# Patient Record
Sex: Female | Born: 1985 | Race: White | Hispanic: No | Marital: Married | Smoking: Never smoker
Health system: Southern US, Community
[De-identification: ages and names within clinical notes are randomized; demographics above are authoritative.]

## PROBLEM LIST (undated history)

## (undated) ENCOUNTER — Ambulatory Visit: Admission: EM | Payer: 59

## (undated) DIAGNOSIS — F419 Anxiety disorder, unspecified: Secondary | ICD-10-CM

## (undated) DIAGNOSIS — R12 Heartburn: Secondary | ICD-10-CM

## (undated) DIAGNOSIS — R7303 Prediabetes: Secondary | ICD-10-CM

## (undated) DIAGNOSIS — I10 Essential (primary) hypertension: Secondary | ICD-10-CM

## (undated) DIAGNOSIS — O139 Gestational [pregnancy-induced] hypertension without significant proteinuria, unspecified trimester: Secondary | ICD-10-CM

## (undated) DIAGNOSIS — K829 Disease of gallbladder, unspecified: Secondary | ICD-10-CM

## (undated) DIAGNOSIS — R0602 Shortness of breath: Secondary | ICD-10-CM

## (undated) DIAGNOSIS — J45909 Unspecified asthma, uncomplicated: Secondary | ICD-10-CM

## (undated) DIAGNOSIS — K760 Fatty (change of) liver, not elsewhere classified: Secondary | ICD-10-CM

## (undated) DIAGNOSIS — F411 Generalized anxiety disorder: Secondary | ICD-10-CM

## (undated) DIAGNOSIS — K589 Irritable bowel syndrome without diarrhea: Secondary | ICD-10-CM

## (undated) DIAGNOSIS — M549 Dorsalgia, unspecified: Secondary | ICD-10-CM

## (undated) DIAGNOSIS — E7800 Pure hypercholesterolemia, unspecified: Secondary | ICD-10-CM

## (undated) DIAGNOSIS — T8859XA Other complications of anesthesia, initial encounter: Secondary | ICD-10-CM

## (undated) DIAGNOSIS — F988 Other specified behavioral and emotional disorders with onset usually occurring in childhood and adolescence: Secondary | ICD-10-CM

## (undated) DIAGNOSIS — Z91018 Allergy to other foods: Secondary | ICD-10-CM

## (undated) DIAGNOSIS — F53 Postpartum depression: Secondary | ICD-10-CM

## (undated) DIAGNOSIS — M2550 Pain in unspecified joint: Secondary | ICD-10-CM

## (undated) HISTORY — DX: Prediabetes: R73.03

## (undated) HISTORY — DX: Fatty (change of) liver, not elsewhere classified: K76.0

## (undated) HISTORY — DX: Disease of gallbladder, unspecified: K82.9

## (undated) HISTORY — DX: Postpartum depression: F53.0

## (undated) HISTORY — DX: Essential (primary) hypertension: I10

## (undated) HISTORY — DX: Other specified behavioral and emotional disorders with onset usually occurring in childhood and adolescence: F98.8

## (undated) HISTORY — DX: Gestational (pregnancy-induced) hypertension without significant proteinuria, unspecified trimester: O13.9

## (undated) HISTORY — DX: Pain in unspecified joint: M25.50

## (undated) HISTORY — DX: Dorsalgia, unspecified: M54.9

## (undated) HISTORY — DX: Heartburn: R12

## (undated) HISTORY — DX: Shortness of breath: R06.02

## (undated) HISTORY — PX: CHOLECYSTECTOMY: SHX55

## (undated) HISTORY — DX: Allergy to other foods: Z91.018

## (undated) HISTORY — DX: Pure hypercholesterolemia, unspecified: E78.00

## (undated) HISTORY — DX: Irritable bowel syndrome, unspecified: K58.9

## (undated) HISTORY — DX: Generalized anxiety disorder: F41.1

## (undated) HISTORY — PX: TONSILLECTOMY: SUR1361

---

## 2020-01-19 DIAGNOSIS — N925 Other specified irregular menstruation: Secondary | ICD-10-CM | POA: Diagnosis not present

## 2020-01-19 DIAGNOSIS — Z113 Encounter for screening for infections with a predominantly sexual mode of transmission: Secondary | ICD-10-CM | POA: Diagnosis not present

## 2020-01-19 DIAGNOSIS — R87810 Cervical high risk human papillomavirus (HPV) DNA test positive: Secondary | ICD-10-CM | POA: Diagnosis not present

## 2020-01-19 DIAGNOSIS — Z124 Encounter for screening for malignant neoplasm of cervix: Secondary | ICD-10-CM | POA: Diagnosis not present

## 2020-01-19 DIAGNOSIS — Z3201 Encounter for pregnancy test, result positive: Secondary | ICD-10-CM | POA: Diagnosis not present

## 2020-01-19 DIAGNOSIS — Z3480 Encounter for supervision of other normal pregnancy, unspecified trimester: Secondary | ICD-10-CM | POA: Diagnosis not present

## 2020-01-23 ENCOUNTER — Encounter (HOSPITAL_COMMUNITY): Payer: Self-pay | Admitting: Obstetrics

## 2020-01-23 ENCOUNTER — Inpatient Hospital Stay (HOSPITAL_COMMUNITY): Payer: 59

## 2020-01-23 ENCOUNTER — Inpatient Hospital Stay (HOSPITAL_COMMUNITY)
Admission: AD | Admit: 2020-01-23 | Discharge: 2020-01-23 | Disposition: A | Discharge status: Home / Self Care | Payer: 59 | Attending: Obstetrics | Admitting: Obstetrics

## 2020-01-23 ENCOUNTER — Other Ambulatory Visit: Payer: Self-pay

## 2020-01-23 DIAGNOSIS — O209 Hemorrhage in early pregnancy, unspecified: Secondary | ICD-10-CM | POA: Diagnosis not present

## 2020-01-23 DIAGNOSIS — Z3A09 9 weeks gestation of pregnancy: Secondary | ICD-10-CM | POA: Insufficient documentation

## 2020-01-23 DIAGNOSIS — O99511 Diseases of the respiratory system complicating pregnancy, first trimester: Secondary | ICD-10-CM | POA: Diagnosis not present

## 2020-01-23 DIAGNOSIS — Z881 Allergy status to other antibiotic agents status: Secondary | ICD-10-CM | POA: Diagnosis not present

## 2020-01-23 DIAGNOSIS — J45909 Unspecified asthma, uncomplicated: Secondary | ICD-10-CM | POA: Diagnosis not present

## 2020-01-23 DIAGNOSIS — Z885 Allergy status to narcotic agent status: Secondary | ICD-10-CM | POA: Diagnosis not present

## 2020-01-23 DIAGNOSIS — O26891 Other specified pregnancy related conditions, first trimester: Secondary | ICD-10-CM | POA: Diagnosis not present

## 2020-01-23 DIAGNOSIS — O4690 Antepartum hemorrhage, unspecified, unspecified trimester: Secondary | ICD-10-CM

## 2020-01-23 DIAGNOSIS — Z880 Allergy status to penicillin: Secondary | ICD-10-CM | POA: Insufficient documentation

## 2020-01-23 DIAGNOSIS — O4691 Antepartum hemorrhage, unspecified, first trimester: Secondary | ICD-10-CM | POA: Diagnosis not present

## 2020-01-23 DIAGNOSIS — Z888 Allergy status to other drugs, medicaments and biological substances status: Secondary | ICD-10-CM | POA: Diagnosis not present

## 2020-01-23 DIAGNOSIS — R1030 Lower abdominal pain, unspecified: Secondary | ICD-10-CM | POA: Insufficient documentation

## 2020-01-23 DIAGNOSIS — Z6790 Unspecified blood type, Rh positive: Secondary | ICD-10-CM

## 2020-01-23 HISTORY — DX: Unspecified asthma, uncomplicated: J45.909

## 2020-01-23 LAB — URINALYSIS, ROUTINE W REFLEX MICROSCOPIC
Bilirubin Urine: NEGATIVE
Glucose, UA: NEGATIVE mg/dL
Ketones, ur: NEGATIVE mg/dL
Leukocytes,Ua: NEGATIVE
Nitrite: NEGATIVE
Protein, ur: NEGATIVE mg/dL
Specific Gravity, Urine: 1.009 (ref 1.005–1.030)
pH: 5 (ref 5.0–8.0)

## 2020-01-23 LAB — ABO/RH: ABO/RH(D): A POS

## 2020-01-23 LAB — CBC
HCT: 36.1 % (ref 36.0–46.0)
Hemoglobin: 12.2 g/dL (ref 12.0–15.0)
MCH: 29.8 pg (ref 26.0–34.0)
MCHC: 33.8 g/dL (ref 30.0–36.0)
MCV: 88 fL (ref 80.0–100.0)
Platelets: 212 10*3/uL (ref 150–400)
RBC: 4.1 MIL/uL (ref 3.87–5.11)
RDW: 11.9 % (ref 11.5–15.5)
WBC: 9.1 10*3/uL (ref 4.0–10.5)
nRBC: 0 % (ref 0.0–0.2)

## 2020-01-23 LAB — HCG, QUANTITATIVE, PREGNANCY: hCG, Beta Chain, Quant, S: 112576 m[IU]/mL — ABNORMAL HIGH (ref <5)

## 2020-01-23 LAB — POCT PREGNANCY, URINE: Preg Test, Ur: POSITIVE — AB

## 2020-01-23 NOTE — MAU Note (Signed)
Presents with lower abdominal cramping & heavy spotting.  Reports VB was pinkish earlier to day and has become darker as day progressed.  Reports unsure if passed clot or tissue.

## 2020-01-23 NOTE — MAU Provider Note (Addendum)
History     CSN: [id number]  Arrival date and time: 01/23/20 1540   First Provider Initiated Contact with Patient 01/23/20 1722      Chief Complaint  Patient presents with  . Vaginal Bleeding  . Cramping   34 y.o. G2P0010 @[redacted]w[redacted]d  presenting with VB. Sx started this am, was pink initially when she wiped then became red and heavier during the day. No recent sex. Reports intermittent low abdominal cramping but not a new symptom. Rates 5/10. Has not used anything for it. Denies urinary sx. Had an in office last week, denies complications.   OB History    Gravida  2   Para      Term      Preterm      AB  1   Living        SAB      TAB      Ectopic      Multiple      Live Births              Past Medical History:  Diagnosis Date  . Asthma     Past Surgical History:  Procedure Laterality Date  . CHOLECYSTECTOMY    . TONSILLECTOMY      No family history on file.  Social History   Tobacco Use  . Smoking status: Not on file  Substance Use Topics  . Alcohol use: Not on file  . Drug use: Not on file    Allergies:  Allergies  Allergen Reactions  . Augmentin [Amoxicillin-Pot Clavulanate] Hives    States that she can take amoxicillin  . Ceclor [Cefaclor] Hives  . Demerol [Meperidine] Hives  . Compazine [Prochlorperazine] Anxiety    No medications prior to admission.    Review of Systems  Gastrointestinal: Positive for abdominal pain.  Genitourinary: Positive for vaginal bleeding. Negative for difficulty urinating, dysuria, hematuria and urgency.   Physical Exam   Blood pressure 133/70, pulse 85, temperature 98.1 F (36.7 C), temperature source Oral, resp. rate 16, height 5\' 1"  (1.549 m), weight 134.3 kg, last menstrual period 11/18/2019, SpO2 100 %.  Physical Exam  Nursing note and vitals reviewed. Constitutional: She is oriented to person, place, and time.  HENT:  Head: Normocephalic and atraumatic.  Cardiovascular: Normal rate.   Respiratory: Effort normal. No respiratory distress.  GI: Soft. Normal appearance. She exhibits no distension and no mass. There is no abdominal tenderness. There is no rebound and no guarding.  Genitourinary:    Genitourinary Comments: External: no lesions or erythema Vagina: rugated, pink, moist, scant F[place]ila bloody discharge Uterus: + enlarged, anteverted, non tender, no CMT Adnexae: no masses, no tenderness left, no tenderness right Cervix closed    Musculoskeletal:        General: Normal range of motion.     Cervical back: Normal range of motion.  Neurological: She is alert and oriented to person, place, and time.  Skin: Skin is warm and dry.  Psychiatric: Her mood appears anxious.   Results for orders placed or performed during the hospital encounter of 01/23/20 (from the past 24 hour(s))  Pregnancy, urine POC     Status: Abnormal   Collection Time: 01/23/20  4:15 PM  Result Value Ref Range   Preg Test, Ur POSITIVE (A) NEGATIVE  Urinalysis, Routine w reflex microscopic     Status: Abnormal   Collection Time: 01/23/20  5:00 PM  Result Value Ref Range   Color, Urine YELLOW YELLOW   APPearance CLEAR  CLEAR   Specific Gravity, Urine 1.009 1.005 - 1.030   pH 5.0 5.0 - 8.0   Glucose, UA NEGATIVE NEGATIVE mg/dL   Hgb urine dipstick LARGE (A) NEGATIVE   Bilirubin Urine NEGATIVE NEGATIVE   Ketones, ur NEGATIVE NEGATIVE mg/dL   Protein, ur NEGATIVE NEGATIVE mg/dL   Nitrite NEGATIVE NEGATIVE   Leukocytes,Ua NEGATIVE NEGATIVE   RBC / HPF 0-5 0 - 5 RBC/hpf   WBC, UA 0-5 0 - 5 WBC/hpf   Bacteria, UA RARE (A) NONE SEEN   Squamous Epithelial / LPF 0-5 0 - 5  ABO/Rh     Status: None   Collection Time: 01/23/20  5:30 PM  Result Value Ref Range   ABO/RH(D) A POS    No rh immune globuloin      NOT A RH IMMUNE GLOBULIN CANDIDATE, PT RH POSITIVE Performed at [facility] [address] [place], [place] [address]   CBC     Status: None   Collection Time: 01/23/20  5:30 PM   Result Value Ref Range   WBC 9.1 4.0 - 10.5 K/uL   RBC 4.10 3.87 - 5.11 MIL/uL   Hemoglobin 12.2 12.0 - 15.0 g/dL   HCT 36.1 36 - 46 %   MCV 88.0 80.0 - 100.0 fL   MCH 29.8 26.0 - 34.0 pg   MCHC 33.8 30.0 - 36.0 g/dL   RDW 11.9 11.5 - 15.5 %   Platelets 212 150 - 400 K/uL   nRBC 0.0 0.0 - 0.2 %   US OB Comp Less 14 Wks  Result Date: 01/23/2020 CLINICAL DATA:  Pregnant patient in first-trimester pregnancy with vaginal bleeding. Lower abdominal cramping. EXAM: OBSTETRIC <14 WK ULTRASOUND TECHNIQUE: Transabdominal ultrasound was performed for evaluation of the gestation as well as the maternal uterus and adnexal regions. COMPARISON:  None. FINDINGS: Intrauterine gestational sac: Single Yolk sac:  Visualized. Embryo:  Visualized. Cardiac Activity: Visualized. Heart Rate: 189 bpm CRL:   28.9 mm   9 w 4 d                  [place] EDC: 08/23/2020 Subchorionic hemorrhage:  None visualized. Maternal uterus/adnexae: Both ovaries are visualized and are normal. No adnexal mass. No pelvic free fluid. IMPRESSION: 1. Single live intrauterine pregnancy estimated gestational age [redacted] weeks 4 days for ultrasound [facility] 08/23/2020. 2. No subchorionic hemorrhage. Electronically Signed   By: Keith Rake M.D.   On: 01/23/2020 17:59   MAU Course  Procedures  MDM Labs and [place] ordered and reviewed. Viable IUP on [place], s/d consistent, no [place]. Discussed results with pt, pt reassured. Stable for discharge home.   Assessment and Plan  [redacted] weeks gestation Rh positive Vaginal bleeding in pregnancy Discharge home Follow up at [facility] as scheduled SAB precautions Pelvic rest  Allergies as of 01/23/2020      Reactions   Augmentin [amoxicillin-pot Clavulanate] Hives   States that she can take amoxicillin   Ceclor [cefaclor] Hives   Demerol [meperidine] Hives   Compazine [prochlorperazine] Anxiety      Medication List    TAKE these medications   acetaminophen 500 MG tablet Commonly known as: TYLENOL Take  1,000 mg by mouth every 6 (six) hours as needed.   albuterol 108 (90 Base) MCG/ACT inhaler Commonly known as: VENTOLIN HFA Inhale 1-2 puffs into the lungs every 6 (six) hours as needed for wheezing or shortness of breath.   prenatal multivitamin Tabs tablet Take 1 tablet by mouth daily  at 12 noon.      Donette Larry, CNM 01/23/2020, 6:33 PM

## 2020-01-23 NOTE — Discharge Instructions (Signed)

## 2020-02-17 DIAGNOSIS — Z3480 Encounter for supervision of other normal pregnancy, unspecified trimester: Secondary | ICD-10-CM | POA: Diagnosis not present

## 2020-02-17 DIAGNOSIS — Z3682 Encounter for antenatal screening for nuchal translucency: Secondary | ICD-10-CM | POA: Diagnosis not present

## 2020-03-08 DIAGNOSIS — Z369 Encounter for antenatal screening, unspecified: Secondary | ICD-10-CM | POA: Diagnosis not present

## 2020-03-08 DIAGNOSIS — Z3482 Encounter for supervision of other normal pregnancy, second trimester: Secondary | ICD-10-CM | POA: Diagnosis not present

## 2020-04-10 DIAGNOSIS — Z363 Encounter for antenatal screening for malformations: Secondary | ICD-10-CM | POA: Diagnosis not present

## 2020-05-07 DIAGNOSIS — Z362 Encounter for other antenatal screening follow-up: Secondary | ICD-10-CM | POA: Diagnosis not present

## 2020-05-08 DIAGNOSIS — Z3689 Encounter for other specified antenatal screening: Secondary | ICD-10-CM | POA: Diagnosis not present

## 2020-06-06 DIAGNOSIS — Z3A28 28 weeks gestation of pregnancy: Secondary | ICD-10-CM | POA: Diagnosis not present

## 2020-06-06 DIAGNOSIS — O3663X0 Maternal care for excessive fetal growth, third trimester, not applicable or unspecified: Secondary | ICD-10-CM | POA: Diagnosis not present

## 2020-06-06 DIAGNOSIS — Z23 Encounter for immunization: Secondary | ICD-10-CM | POA: Diagnosis not present

## 2020-06-15 ENCOUNTER — Inpatient Hospital Stay (HOSPITAL_COMMUNITY)
Admission: AD | Admit: 2020-06-15 | Discharge: 2020-06-15 | Disposition: A | Discharge status: Home / Self Care | Payer: 59 | Attending: Obstetrics and Gynecology | Admitting: Obstetrics and Gynecology

## 2020-06-15 ENCOUNTER — Encounter (HOSPITAL_COMMUNITY): Payer: Self-pay | Admitting: Obstetrics and Gynecology

## 2020-06-15 DIAGNOSIS — J45909 Unspecified asthma, uncomplicated: Secondary | ICD-10-CM | POA: Insufficient documentation

## 2020-06-15 DIAGNOSIS — O368130 Decreased fetal movements, third trimester, not applicable or unspecified: Secondary | ICD-10-CM | POA: Insufficient documentation

## 2020-06-15 DIAGNOSIS — O99513 Diseases of the respiratory system complicating pregnancy, third trimester: Secondary | ICD-10-CM | POA: Diagnosis not present

## 2020-06-15 DIAGNOSIS — Z3A30 30 weeks gestation of pregnancy: Secondary | ICD-10-CM | POA: Insufficient documentation

## 2020-06-15 HISTORY — DX: Other complications of anesthesia, initial encounter: T88.59XA

## 2020-06-15 NOTE — MAU Note (Signed)
Has not had a lot of fetal movement this past wk.  Contacted office, midwife wanted her to come over for an NST. No pain, bleeding or water leaking.

## 2020-06-15 NOTE — MAU Provider Note (Signed)
Chief Complaint:  Decreased Fetal Movement  First Provider Initiated Contact with Patient 06/15/20 1847     HPI: Tracy Chang is a 34 y.o. G2P0010 at 4w0dwho presents to maternity admissions reporting decreased fetal movement over the last week. States she feels the baby move but not as regularly as she had felt him. Denies leaking of fluid, vaginal bleeding, or contractions. Mother present and supportive.  Past Medical History: Past Medical History:  Diagnosis Date   Asthma    Complication of anesthesia    Conscious sedation-extreme agitation    Past obstetric history: OB History  Gravida Para Term Preterm AB Living  2       1    SAB TAB Ectopic Multiple Live Births               # Outcome Date GA Lbr Len/2nd Weight Sex Delivery Anes PTL Lv  2 Current           1 AB 2019            Past Surgical History: Past Surgical History:  Procedure Laterality Date   CHOLECYSTECTOMY     TONSILLECTOMY      Family History: No family history on file.  Social History: Social History   Tobacco Use   Smoking status: Not on file  Substance Use Topics   Alcohol use: Not on file   Drug use: Not on file    Allergies:  Allergies  Allergen Reactions   Augmentin [Amoxicillin-Pot Clavulanate] Hives    States that she can take amoxicillin   Ceclor [Cefaclor] Hives   Demerol [Meperidine] Hives   Latex    Compazine [Prochlorperazine] Anxiety    Meds:  Medications Prior to Admission  Medication Sig Dispense Refill Last Dose   acetaminophen (TYLENOL) 500 MG tablet Take 1,000 mg by mouth every 6 (six) hours as needed.      albuterol (VENTOLIN HFA) 108 (90 Base) MCG/ACT inhaler Inhale 1-2 puffs into the lungs every 6 (six) hours as needed for wheezing or shortness of breath.      Prenatal Vit-Fe Fumarate-FA (PRENATAL MULTIVITAMIN) TABS tablet Take 1 tablet by mouth daily at 12 noon.       I have reviewed patient's Past Medical Hx, Surgical Hx, Family Hx, Social Hx,  medications and allergies.   ROS:  Review of Systems  All other systems reviewed and are negative.  Other systems negative  Physical Exam   Patient Vitals for the past 24 hrs:  BP Temp Temp src Pulse Resp SpO2 Height Weight  06/15/20 1720 136/76 98.2 F (36.8 C) Oral 82 20 98 % 5\' 1"  (1.549 m) (!) 141.5 kg   Constitutional: Well-developed, well-nourished female in no acute distress.  Cardiovascular: normal rate and rhythm Respiratory: normal effort, clear to auscultation bilaterally GI: Abd soft, non-tender, gravid appropriate for gestational age.   No rebound or guarding. MS: Extremities nontender, no edema, normal ROM Neurologic: Alert and oriented x 4.  GU: Neg CVAT.  FHT: Baseline 135, moderate variability, accelerations present, no decelerations Contractions: None    MAU Course/MDM: I have ordered labs and reviewed results.  NST reviewed Consult with Dr. to discuss exam findings and test results.    Assessment: 1. Decreased fetal movement affecting management of pregnancy in third trimester, single or unspecified fetus    Plan: Discharge home Discussed labor precautions and fetal kick counts Follow up in office in 1 week at next scheduled appointment.  Follow-up Information    Obgyn, Amado Nash.  Schedule an appointment as soon as possible for a visit in 1 week(s).   Contact information: [address] [place] [address] [phone]              Pt stable at time of discharge.  June Leap 06/15/2020, 7:05 PM

## 2020-06-18 DIAGNOSIS — Z3689 Encounter for other specified antenatal screening: Secondary | ICD-10-CM | POA: Diagnosis not present

## 2020-06-18 DIAGNOSIS — Z3402 Encounter for supervision of normal first pregnancy, second trimester: Secondary | ICD-10-CM | POA: Diagnosis not present

## 2020-06-19 ENCOUNTER — Inpatient Hospital Stay: Payer: 59 | Attending: Oncology

## 2020-06-19 DIAGNOSIS — Z23 Encounter for immunization: Secondary | ICD-10-CM | POA: Insufficient documentation

## 2020-07-04 DIAGNOSIS — O3663X0 Maternal care for excessive fetal growth, third trimester, not applicable or unspecified: Secondary | ICD-10-CM | POA: Diagnosis not present

## 2020-07-04 DIAGNOSIS — Z3A32 32 weeks gestation of pregnancy: Secondary | ICD-10-CM | POA: Diagnosis not present

## 2020-07-12 DIAGNOSIS — L299 Pruritus, unspecified: Secondary | ICD-10-CM | POA: Diagnosis not present

## 2020-07-12 DIAGNOSIS — Z3A33 33 weeks gestation of pregnancy: Secondary | ICD-10-CM | POA: Diagnosis not present

## 2020-07-12 DIAGNOSIS — I10 Essential (primary) hypertension: Secondary | ICD-10-CM | POA: Diagnosis not present

## 2020-07-12 DIAGNOSIS — O133 Gestational [pregnancy-induced] hypertension without significant proteinuria, third trimester: Secondary | ICD-10-CM | POA: Diagnosis not present

## 2020-07-13 DIAGNOSIS — Z3483 Encounter for supervision of other normal pregnancy, third trimester: Secondary | ICD-10-CM | POA: Diagnosis not present

## 2020-07-13 DIAGNOSIS — Z3A34 34 weeks gestation of pregnancy: Secondary | ICD-10-CM | POA: Diagnosis not present

## 2020-07-13 DIAGNOSIS — O139 Gestational [pregnancy-induced] hypertension without significant proteinuria, unspecified trimester: Secondary | ICD-10-CM | POA: Diagnosis not present

## 2020-07-13 DIAGNOSIS — Z3482 Encounter for supervision of other normal pregnancy, second trimester: Secondary | ICD-10-CM | POA: Diagnosis not present

## 2020-07-16 ENCOUNTER — Inpatient Hospital Stay (HOSPITAL_COMMUNITY): Payer: 59

## 2020-07-16 ENCOUNTER — Encounter (HOSPITAL_COMMUNITY): Payer: Self-pay | Admitting: Obstetrics & Gynecology

## 2020-07-16 ENCOUNTER — Inpatient Hospital Stay (HOSPITAL_COMMUNITY)
Admission: AD | Admit: 2020-07-16 | Discharge: 2020-07-17 | Disposition: A | Discharge status: Home / Self Care | Payer: 59 | Attending: Obstetrics & Gynecology | Admitting: Obstetrics & Gynecology

## 2020-07-16 DIAGNOSIS — I517 Cardiomegaly: Secondary | ICD-10-CM | POA: Diagnosis not present

## 2020-07-16 DIAGNOSIS — R0602 Shortness of breath: Secondary | ICD-10-CM | POA: Diagnosis not present

## 2020-07-16 DIAGNOSIS — Z3A34 34 weeks gestation of pregnancy: Secondary | ICD-10-CM | POA: Diagnosis not present

## 2020-07-16 DIAGNOSIS — Z20822 Contact with and (suspected) exposure to covid-19: Secondary | ICD-10-CM | POA: Diagnosis not present

## 2020-07-16 DIAGNOSIS — O133 Gestational [pregnancy-induced] hypertension without significant proteinuria, third trimester: Secondary | ICD-10-CM | POA: Insufficient documentation

## 2020-07-16 DIAGNOSIS — R079 Chest pain, unspecified: Secondary | ICD-10-CM | POA: Diagnosis not present

## 2020-07-16 DIAGNOSIS — Z3689 Encounter for other specified antenatal screening: Secondary | ICD-10-CM | POA: Diagnosis not present

## 2020-07-16 DIAGNOSIS — J90 Pleural effusion, not elsewhere classified: Secondary | ICD-10-CM | POA: Diagnosis not present

## 2020-07-16 DIAGNOSIS — Z3480 Encounter for supervision of other normal pregnancy, unspecified trimester: Secondary | ICD-10-CM | POA: Diagnosis not present

## 2020-07-16 LAB — CBC
HCT: 33.6 % — ABNORMAL LOW (ref 36.0–46.0)
Hemoglobin: 11 g/dL — ABNORMAL LOW (ref 12.0–15.0)
MCH: 29.3 pg (ref 26.0–34.0)
MCHC: 32.7 g/dL (ref 30.0–36.0)
MCV: 89.6 fL (ref 80.0–100.0)
Platelets: 175 10*3/uL (ref 150–400)
RBC: 3.75 MIL/uL — ABNORMAL LOW (ref 3.87–5.11)
RDW: 13.2 % (ref 11.5–15.5)
WBC: 11.6 10*3/uL — ABNORMAL HIGH (ref 4.0–10.5)
nRBC: 0 % (ref 0.0–0.2)

## 2020-07-16 LAB — COMPREHENSIVE METABOLIC PANEL
ALT: 29 U/L (ref 0–44)
AST: 23 U/L (ref 15–41)
Albumin: 2.7 g/dL — ABNORMAL LOW (ref 3.5–5.0)
Alkaline Phosphatase: 87 U/L (ref 38–126)
Anion gap: 9 (ref 5–15)
BUN: 7 mg/dL (ref 6–20)
CO2: 26 mmol/L (ref 22–32)
Calcium: 9.2 mg/dL (ref 8.9–10.3)
Chloride: 104 mmol/L (ref 98–111)
Creatinine, Ser: 0.58 mg/dL (ref 0.44–1.00)
GFR, Estimated: >60 mL/min (ref >60)
Glucose, Bld: 126 mg/dL — ABNORMAL HIGH (ref 70–99)
Potassium: 3.5 mmol/L (ref 3.5–5.1)
Sodium: 139 mmol/L (ref 135–145)
Total Bilirubin: 0.5 mg/dL (ref 0.3–1.2)
Total Protein: 5.6 g/dL — ABNORMAL LOW (ref 6.5–8.1)

## 2020-07-16 LAB — PROTEIN / CREATININE RATIO, URINE
Creatinine, Urine: 49.6 mg/dL
Protein Creatinine Ratio: 0.2 mg/mg{Cre} — ABNORMAL HIGH (ref 0.00–0.15)
Total Protein, Urine: 10 mg/dL

## 2020-07-16 LAB — RESP PANEL BY RT-PCR (FLU A&B, COVID) ARPGX2
Influenza A by PCR: NEGATIVE
Influenza B by PCR: NEGATIVE
SARS Coronavirus 2 by RT PCR: NEGATIVE

## 2020-07-16 MED ORDER — IOHEXOL 350 MG/ML SOLN
100.0000 mL | Freq: Once | INTRAVENOUS | Status: AC | PRN
  Administered 2020-07-16: 100 mL via INTRAVENOUS

## 2020-07-16 NOTE — MAU Provider Note (Addendum)
History     CSN: [id number]  Arrival date and time: 07/16/20 3329   First Provider Initiated Contact with Patient 07/16/20 2121      Chief Complaint  Patient presents with  . Shortness of Breath  . wt gain   Tracy Chang is a 34 y.o. year old G27P0010 female at [redacted]w[redacted]d weeks gestation who was sent to MAU by [facility] reporting recent dx of gHTN, started on Labetalol 100 mg TID, worsening SOB, dependent edema, 14 lb weight gain (5 lbs since Thursday 07/12/20). She has a h/o asthma and has had to increase use of her inhaler; which is not helping with the SOB. She is having "difficulty catching a good breath." She noticed the increase in SOB after starting Labetalol. She has normal PEC labs Thursday; P/C ratio was 0.23. She received BMZ 12 mg injections x 2 on 12/2 & 12/3. BPP was 8/98 today in the office.   OB History    Gravida  2   Para      Term      Preterm      AB  1   Living        SAB      TAB      Ectopic      Multiple      Live Births              Past Medical History:  Diagnosis Date  . Asthma   . Complication of anesthesia    Conscious sedation-extreme agitation    Past Surgical History:  Procedure Laterality Date  . CHOLECYSTECTOMY    . TONSILLECTOMY      History reviewed. No pertinent family history.  Social History   Tobacco Use  . Smoking status: Never Smoker  . Smokeless tobacco: Never Used  Substance Use Topics  . Alcohol use: Yes    Comment: SOCIAL  . Drug use: Never    Allergies:  Allergies  Allergen Reactions  . Augmentin [Amoxicillin-Pot Clavulanate] Hives    States that she can take amoxicillin  . Ceclor [Cefaclor] Hives  . Demerol [Meperidine] Hives  . Latex   . Compazine [Prochlorperazine] Anxiety    No medications prior to admission.    Review of Systems  Constitutional: Negative.   HENT: Negative.   Eyes: Negative.   Respiratory: Positive for shortness of breath and wheezing (with exertion  ("moving around, getting up to the bathroom"; have increased use of inhaler without relief).   Cardiovascular: Positive for leg swelling.  Gastrointestinal: Negative.   Endocrine: Negative.   Genitourinary: Negative.   Musculoskeletal: Negative.   Skin: Negative.   Allergic/Immunologic: Negative.   Neurological: Positive for numbness (of hands/fingertips at times).  Hematological: Negative.   Psychiatric/Behavioral: Negative.    Physical Exam   Patient Vitals for the past 24 hrs:  BP Temp Temp src Pulse Resp SpO2 Height Weight  07/17/20 0008 132/61 97.7 F (36.5 C) Oral 81 20 96 % -- --  07/16/20 2350 -- -- -- -- -- 97 % -- --  07/16/20 2345 -- -- -- -- -- 97 % -- --  07/16/20 2220 -- -- -- -- -- 98 % -- --  07/16/20 2210 -- -- -- -- -- 97 % -- --  07/16/20 2201 (!) 148/77 -- -- 82 -- -- -- --  07/16/20 2132 (!) 150/82 -- -- 82 -- -- -- --  07/16/20 2130 -- -- -- -- -- 96 % -- --  07/16/20 2120 (!) 148/77 -- --  77 -- 95 % -- --  07/16/20 2058 138/68 -- -- 79 -- -- -- --  07/16/20 2032 134/70 -- -- 83 -- -- -- --  07/16/20 2030 -- -- -- -- -- 97 % -- --  07/16/20 2025 -- -- -- -- -- 97 % -- --  07/16/20 2018 (!) 149/76 -- -- 84 -- -- -- --  07/16/20 2015 -- -- -- -- -- 96 % -- --  07/16/20 2010 -- -- -- -- -- 96 % -- --  07/16/20 2005 -- -- -- -- -- 97 % -- --  07/16/20 2000 -- -- -- -- -- 97 % -- --  07/16/20 1955 -- -- -- -- -- 98 % -- --  07/16/20 1950 -- -- -- -- -- 97 % -- --  07/16/20 1945 -- -- -- -- -- 97 % -- --  07/16/20 1940 -- -- -- -- -- 98 % -- --  07/16/20 1935 -- -- -- -- -- 98 % -- --  07/16/20 1932 124/61 -- -- 86 -- -- -- --  07/16/20 1930 -- -- -- -- -- 97 % -- --  07/16/20 1850 136/68 98.5 F (36.9 C) Oral 84 (!) 24 97 % 5\' 1"  (1.549 m) (!) 147.6 kg  07/16/20 1849 -- -- -- -- -- 98 % -- --    Physical Exam Vitals and nursing note reviewed.  Constitutional:      Appearance: She is well-developed. She is obese.  HENT:     Head: Normocephalic and  atraumatic.  Cardiovascular:     Rate and Rhythm: Normal rate and regular rhythm.     Pulses: Normal pulses.  Pulmonary:     Effort: Respiratory distress present.     Breath sounds: Normal breath sounds. No wheezing, rhonchi or rales.  Abdominal:     General: Bowel sounds are normal.     Palpations: Abdomen is soft.  Genitourinary:    Comments: deferred Musculoskeletal:        General: Swelling (both hands) present. Normal range of motion.     Cervical back: Normal range of motion.     Right lower leg: Edema present.     Left lower leg: Edema (>RT) present.  Skin:    General: Skin is warm and dry.  Neurological:     Mental Status: She is alert and oriented to person, place, and time.  Psychiatric:        Mood and Affect: Mood normal.        Behavior: Behavior normal.        Thought Content: Thought content normal.        Judgment: Judgment normal.   EFM: 150 bpm, mod variability, + accels, no decels Toco: rare  Results for orders placed or performed during the hospital encounter of 07/16/20 (from the past 24 hour(s))  CBC     Status: Abnormal   Collection Time: 07/16/20  6:36 PM  Result Value Ref Range   WBC 11.6 (H) 4.0 - 10.5 K/uL   RBC 3.75 (L) 3.87 - 5.11 MIL/uL   Hemoglobin 11.0 (L) 12.0 - 15.0 g/dL   HCT 14/06/21 (L) 36 - 46 %   MCV 89.6 80.0 - 100.0 fL   MCH 29.3 26.0 - 34.0 pg   MCHC 32.7 30.0 - 36.0 g/dL   RDW 49.7 02.6 - 37.8 %   Platelets 175 150 - 400 K/uL   nRBC 0.0 0.0 - 0.2 %  Comprehensive metabolic panel     Status:  Abnormal   Collection Time: 07/16/20  6:36 PM  Result Value Ref Range   Sodium 139 135 - 145 mmol/L   Potassium 3.5 3.5 - 5.1 mmol/L   Chloride 104 98 - 111 mmol/L   CO2 26 22 - 32 mmol/L   Glucose, Bld 126 (H) 70 - 99 mg/dL   BUN 7 6 - 20 mg/dL   Creatinine, Ser 3.080.58 0.44 - 1.00 mg/dL   Calcium 9.2 8.9 - 65.710.3 mg/dL   Total Protein 5.6 (L) 6.5 - 8.1 g/dL   Albumin 2.7 (L) 3.5 - 5.0 g/dL   AST 23 15 - 41 U/L   ALT 29 0 - 44 U/L    Alkaline Phosphatase 87 38 - 126 U/L   Total Bilirubin 0.5 0.3 - 1.2 mg/dL   GFR, Estimated >84>60 >69>60 mL/min   Anion gap 9 5 - 15  Protein / creatinine ratio, urine     Status: Abnormal   Collection Time: 07/16/20  8:30 PM  Result Value Ref Range   Creatinine, Urine 49.60 mg/dL   Total Protein, Urine 10 mg/dL   Protein Creatinine Ratio 0.20 (H) 0.00 - 0.15 mg/mg[Cre]  Resp Panel by RT-PCR (Flu A&B, Covid) Nasopharyngeal Swab     Status: None   Collection Time: 07/16/20 10:04 PM   Specimen: Nasopharyngeal Swab; Nasopharyngeal(NP) swabs in vial transport medium  Result Value Ref Range   SARS Coronavirus 2 by RT PCR NEGATIVE NEGATIVE   Influenza A by PCR NEGATIVE NEGATIVE   Influenza B by PCR NEGATIVE NEGATIVE   CT Angio Chest PE W and/or Wo Contrast  Result Date: 07/16/2020 CLINICAL DATA:  Chest pain. Shortness of breath. Thirty-four weeks pregnant. EXAM: CT ANGIOGRAPHY CHEST WITH CONTRAST TECHNIQUE: Multidetector CT imaging of the chest was performed using the standard protocol during bolus administration of intravenous contrast. Multiplanar CT image reconstructions and MIPs were obtained to evaluate the vascular anatomy. CONTRAST:  100mL OMNIPAQUE IOHEXOL 350 MG/ML SOLN COMPARISON:  None. FINDINGS: Cardiovascular: Satisfactory contrast bolus. Beam attenuation caused by patient body habitus limits assessment beyond the proximal segmental level. Within that limitation, there is no central pulmonary embolus. The size of the main pulmonary artery is normal. Mild cardiomegaly. The course and caliber of the aorta are normal. There is no atherosclerotic calcification. Opacification decreased due to pulmonary arterial phase contrast bolus timing. Mediastinum/Nodes: -- No mediastinal lymphadenopathy. -- No hilar lymphadenopathy. -- No axillary lymphadenopathy. -- No supraclavicular lymphadenopathy. -- Normal thyroid gland where visualized. -  Unremarkable esophagus. Lungs/Pleura: There is a trace left-sided  pleural effusion. There is no focal infiltrate. There is no pneumothorax. Upper Abdomen: Contrast bolus timing is not optimized for evaluation of the abdominal organs. The visualized portions of the organs of the upper abdomen are normal. Musculoskeletal: No chest wall abnormality. No bony spinal canal stenosis. Review of the MIP images confirms the above findings. IMPRESSION: 1. Habitus limited study. Within that limitation, there is no central pulmonary embolus. 2. Trace left-sided pleural effusion. 3. Mild cardiomegaly. Electronically Signed   By: Katherine Mantlehristopher  Green M.D.   On: 07/16/2020 23:10   DG Chest Portable 1 View  Result Date: 07/16/2020 CLINICAL DATA:  Shortness of breath. EXAM: PORTABLE CHEST 1 VIEW COMPARISON:  None. FINDINGS: Enlarged cardiac silhouette. Otherwise cardiomediastinal silhouette are unremarkable. No focal consolidation. No pulmonary edema. No pleural effusion. No pneumothorax. No acute osseous abnormality. IMPRESSION: Enlarged cardiac silhouette. Electronically Signed   By: Tish FredericksonMorgane  Naveau M.D.   On: 07/16/2020 21:39   MAU Course  Procedures  MDM CCUA CBC CMP P/C Ratio Serial BP's  EKG 1 view CXR CT scan  Report given to and care assumed by Donette Larry, CNM @ 2157  Raelyn Mora, CNM 07/16/2020, 9:21 PM  Labs and imaging reviewed. No signs of PE, pulmonary edema, or PEC. o2 sat 97% on RA and normal WOB on reassessment, and pt reports improvement. Sx likely d/t BMI and advancing gestation. Consult with Dr. Donavan Foil, plan for outpt management.   Assessment and Plan    1. [redacted] weeks gestation of pregnancy   2. NST (non-stress test) reactive   3. Gestational hypertension, third trimester    Discharge home Follow up at [facility] in 3 days Strict PEC precautions  Allergies as of 07/17/2020      Reactions   Augmentin [amoxicillin-pot Clavulanate] Hives   States that she can take amoxicillin   Ceclor [cefaclor] Hives   Demerol [meperidine] Hives   Latex     Compazine [prochlorperazine] Anxiety      Medication List    TAKE these medications   acetaminophen 500 MG tablet Commonly known as: TYLENOL Take 1,000 mg by mouth every 6 (six) hours as needed.   albuterol 108 (90 Base) MCG/ACT inhaler Commonly known as: VENTOLIN HFA Inhale 1-2 puffs into the lungs every 6 (six) hours as needed for wheezing or shortness of breath.   labetalol 100 MG tablet Commonly known as: NORMODYNE Take 100 mg by mouth in the morning, at noon, and at bedtime.   prenatal multivitamin Tabs tablet Take 1 tablet by mouth daily at 12 noon.      Donette Larry, CNM  07/17/2020 12:41 AM

## 2020-07-16 NOTE — MAU Note (Signed)
Pt almost ran in when name called, encouraged her to slow down. SOB started last Friday.  Was started on Labetalol TID received Betamethasone injections last Thu/Fri.  Gained 9# last wk and 7 this wk.   Denies pain, bleeding or leaking. Slight HA, denies visual changes or epigastric pain. Report increased swelling all over.

## 2020-07-17 DIAGNOSIS — M5489 Other dorsalgia: Secondary | ICD-10-CM | POA: Diagnosis not present

## 2020-07-17 DIAGNOSIS — Z3689 Encounter for other specified antenatal screening: Secondary | ICD-10-CM | POA: Diagnosis not present

## 2020-07-17 DIAGNOSIS — Z3A34 34 weeks gestation of pregnancy: Secondary | ICD-10-CM | POA: Diagnosis not present

## 2020-07-17 DIAGNOSIS — O133 Gestational [pregnancy-induced] hypertension without significant proteinuria, third trimester: Secondary | ICD-10-CM | POA: Diagnosis not present

## 2020-07-17 DIAGNOSIS — Z20822 Contact with and (suspected) exposure to covid-19: Secondary | ICD-10-CM | POA: Diagnosis not present

## 2020-07-17 NOTE — Discharge Instructions (Signed)

## 2020-07-19 DIAGNOSIS — Z3483 Encounter for supervision of other normal pregnancy, third trimester: Secondary | ICD-10-CM | POA: Diagnosis not present

## 2020-07-19 DIAGNOSIS — O133 Gestational [pregnancy-induced] hypertension without significant proteinuria, third trimester: Secondary | ICD-10-CM | POA: Diagnosis not present

## 2020-07-19 DIAGNOSIS — Z3A34 34 weeks gestation of pregnancy: Secondary | ICD-10-CM | POA: Diagnosis not present

## 2020-07-23 ENCOUNTER — Telehealth (HOSPITAL_COMMUNITY): Payer: Self-pay | Admitting: *Deleted

## 2020-07-23 DIAGNOSIS — Z3A35 35 weeks gestation of pregnancy: Secondary | ICD-10-CM | POA: Diagnosis not present

## 2020-07-23 DIAGNOSIS — Z7689 Persons encountering health services in other specified circumstances: Secondary | ICD-10-CM | POA: Diagnosis not present

## 2020-07-23 DIAGNOSIS — O99213 Obesity complicating pregnancy, third trimester: Secondary | ICD-10-CM | POA: Diagnosis not present

## 2020-07-23 DIAGNOSIS — O133 Gestational [pregnancy-induced] hypertension without significant proteinuria, third trimester: Secondary | ICD-10-CM | POA: Diagnosis not present

## 2020-07-23 DIAGNOSIS — Z3685 Encounter for antenatal screening for Streptococcus B: Secondary | ICD-10-CM | POA: Diagnosis not present

## 2020-07-23 DIAGNOSIS — O163 Unspecified maternal hypertension, third trimester: Secondary | ICD-10-CM | POA: Diagnosis not present

## 2020-07-23 DIAGNOSIS — O169 Unspecified maternal hypertension, unspecified trimester: Secondary | ICD-10-CM | POA: Diagnosis not present

## 2020-07-23 NOTE — Telephone Encounter (Signed)
Preadmission screen  

## 2020-07-24 ENCOUNTER — Encounter (HOSPITAL_COMMUNITY): Payer: Self-pay | Admitting: *Deleted

## 2020-07-24 ENCOUNTER — Telehealth (HOSPITAL_COMMUNITY): Payer: Self-pay | Admitting: *Deleted

## 2020-07-24 NOTE — Telephone Encounter (Signed)
Preadmission screen  

## 2020-07-30 DIAGNOSIS — Z3A36 36 weeks gestation of pregnancy: Secondary | ICD-10-CM | POA: Diagnosis not present

## 2020-07-30 DIAGNOSIS — O99213 Obesity complicating pregnancy, third trimester: Secondary | ICD-10-CM | POA: Diagnosis not present

## 2020-07-30 DIAGNOSIS — O164 Unspecified maternal hypertension, complicating childbirth: Secondary | ICD-10-CM | POA: Diagnosis not present

## 2020-07-30 DIAGNOSIS — O133 Gestational [pregnancy-induced] hypertension without significant proteinuria, third trimester: Secondary | ICD-10-CM | POA: Diagnosis not present

## 2020-08-05 ENCOUNTER — Encounter (HOSPITAL_COMMUNITY): Payer: Self-pay | Admitting: Obstetrics and Gynecology

## 2020-08-05 ENCOUNTER — Inpatient Hospital Stay (HOSPITAL_COMMUNITY): Payer: 59 | Admitting: Anesthesiology

## 2020-08-05 ENCOUNTER — Inpatient Hospital Stay (HOSPITAL_COMMUNITY)
Admission: AD | Admit: 2020-08-05 | Discharge: 2020-08-09 | DRG: 787 | Disposition: A | Discharge status: Home / Self Care | Payer: 59 | Attending: Obstetrics and Gynecology | Admitting: Obstetrics and Gynecology

## 2020-08-05 ENCOUNTER — Other Ambulatory Visit: Payer: Self-pay

## 2020-08-05 ENCOUNTER — Inpatient Hospital Stay (HOSPITAL_COMMUNITY): Payer: 59

## 2020-08-05 DIAGNOSIS — Z7982 Long term (current) use of aspirin: Secondary | ICD-10-CM

## 2020-08-05 DIAGNOSIS — Z79899 Other long term (current) drug therapy: Secondary | ICD-10-CM | POA: Diagnosis not present

## 2020-08-05 DIAGNOSIS — K219 Gastro-esophageal reflux disease without esophagitis: Secondary | ICD-10-CM | POA: Diagnosis present

## 2020-08-05 DIAGNOSIS — O139 Gestational [pregnancy-induced] hypertension without significant proteinuria, unspecified trimester: Secondary | ICD-10-CM | POA: Diagnosis present

## 2020-08-05 DIAGNOSIS — Z20822 Contact with and (suspected) exposure to covid-19: Secondary | ICD-10-CM | POA: Diagnosis present

## 2020-08-05 DIAGNOSIS — O9962 Diseases of the digestive system complicating childbirth: Secondary | ICD-10-CM | POA: Diagnosis present

## 2020-08-05 DIAGNOSIS — D62 Acute posthemorrhagic anemia: Secondary | ICD-10-CM | POA: Diagnosis not present

## 2020-08-05 DIAGNOSIS — O134 Gestational [pregnancy-induced] hypertension without significant proteinuria, complicating childbirth: Secondary | ICD-10-CM | POA: Diagnosis not present

## 2020-08-05 DIAGNOSIS — O99214 Obesity complicating childbirth: Secondary | ICD-10-CM | POA: Diagnosis present

## 2020-08-05 DIAGNOSIS — Z98891 History of uterine scar from previous surgery: Secondary | ICD-10-CM

## 2020-08-05 DIAGNOSIS — Z3A37 37 weeks gestation of pregnancy: Secondary | ICD-10-CM

## 2020-08-05 DIAGNOSIS — F419 Anxiety disorder, unspecified: Secondary | ICD-10-CM | POA: Diagnosis present

## 2020-08-05 DIAGNOSIS — Z3A39 39 weeks gestation of pregnancy: Secondary | ICD-10-CM | POA: Diagnosis not present

## 2020-08-05 DIAGNOSIS — O9081 Anemia of the puerperium: Secondary | ICD-10-CM | POA: Diagnosis not present

## 2020-08-05 DIAGNOSIS — F32A Depression, unspecified: Secondary | ICD-10-CM | POA: Diagnosis present

## 2020-08-05 DIAGNOSIS — O9952 Diseases of the respiratory system complicating childbirth: Secondary | ICD-10-CM | POA: Diagnosis present

## 2020-08-05 DIAGNOSIS — O99344 Other mental disorders complicating childbirth: Secondary | ICD-10-CM | POA: Diagnosis present

## 2020-08-05 DIAGNOSIS — O3663X0 Maternal care for excessive fetal growth, third trimester, not applicable or unspecified: Secondary | ICD-10-CM | POA: Diagnosis present

## 2020-08-05 DIAGNOSIS — J45909 Unspecified asthma, uncomplicated: Secondary | ICD-10-CM | POA: Diagnosis present

## 2020-08-05 DIAGNOSIS — O324XX0 Maternal care for high head at term, not applicable or unspecified: Secondary | ICD-10-CM | POA: Diagnosis present

## 2020-08-05 DIAGNOSIS — Z412 Encounter for routine and ritual male circumcision: Secondary | ICD-10-CM | POA: Diagnosis not present

## 2020-08-05 HISTORY — DX: Anxiety disorder, unspecified: F41.9

## 2020-08-05 LAB — COMPREHENSIVE METABOLIC PANEL
ALT: 15 U/L (ref 0–44)
AST: 17 U/L (ref 15–41)
Albumin: 2.7 g/dL — ABNORMAL LOW (ref 3.5–5.0)
Alkaline Phosphatase: 108 U/L (ref 38–126)
Anion gap: 11 (ref 5–15)
BUN: 13 mg/dL (ref 6–20)
CO2: 23 mmol/L (ref 22–32)
Calcium: 9.6 mg/dL (ref 8.9–10.3)
Chloride: 103 mmol/L (ref 98–111)
Creatinine, Ser: 0.7 mg/dL (ref 0.44–1.00)
GFR, Estimated: >60 mL/min (ref >60)
Glucose, Bld: 105 mg/dL — ABNORMAL HIGH (ref 70–99)
Potassium: 3.7 mmol/L (ref 3.5–5.1)
Sodium: 137 mmol/L (ref 135–145)
Total Bilirubin: 0.2 mg/dL — ABNORMAL LOW (ref 0.3–1.2)
Total Protein: 5.9 g/dL — ABNORMAL LOW (ref 6.5–8.1)

## 2020-08-05 LAB — CBC WITH DIFFERENTIAL/PLATELET
Abs Immature Granulocytes: 0.04 10*3/uL (ref 0.00–0.07)
Basophils Absolute: 0 10*3/uL (ref 0.0–0.1)
Basophils Relative: 0 %
Eosinophils Absolute: 0.1 10*3/uL (ref 0.0–0.5)
Eosinophils Relative: 1 %
HCT: 35 % — ABNORMAL LOW (ref 36.0–46.0)
Hemoglobin: 11.9 g/dL — ABNORMAL LOW (ref 12.0–15.0)
Immature Granulocytes: 0 %
Lymphocytes Relative: 11 %
Lymphs Abs: 1.3 10*3/uL (ref 0.7–4.0)
MCH: 29.7 pg (ref 26.0–34.0)
MCHC: 34 g/dL (ref 30.0–36.0)
MCV: 87.3 fL (ref 80.0–100.0)
Monocytes Absolute: 0.6 10*3/uL (ref 0.1–1.0)
Monocytes Relative: 6 %
Neutro Abs: 9.4 10*3/uL — ABNORMAL HIGH (ref 1.7–7.7)
Neutrophils Relative %: 82 %
Platelets: 192 10*3/uL (ref 150–400)
RBC: 4.01 MIL/uL (ref 3.87–5.11)
RDW: 13.4 % (ref 11.5–15.5)
WBC: 11.6 10*3/uL — ABNORMAL HIGH (ref 4.0–10.5)
nRBC: 0 % (ref 0.0–0.2)

## 2020-08-05 LAB — CBC
HCT: 33.9 % — ABNORMAL LOW (ref 36.0–46.0)
Hemoglobin: 11.8 g/dL — ABNORMAL LOW (ref 12.0–15.0)
MCH: 30.8 pg (ref 26.0–34.0)
MCHC: 34.8 g/dL (ref 30.0–36.0)
MCV: 88.5 fL (ref 80.0–100.0)
Platelets: 181 10*3/uL (ref 150–400)
RBC: 3.83 MIL/uL — ABNORMAL LOW (ref 3.87–5.11)
RDW: 13.2 % (ref 11.5–15.5)
WBC: 12.6 10*3/uL — ABNORMAL HIGH (ref 4.0–10.5)
nRBC: 0 % (ref 0.0–0.2)

## 2020-08-05 LAB — TYPE AND SCREEN
ABO/RH(D): A POS
Antibody Screen: NEGATIVE

## 2020-08-05 LAB — RPR: RPR Ser Ql: NONREACTIVE

## 2020-08-05 LAB — RESP PANEL BY RT-PCR (FLU A&B, COVID) ARPGX2
Influenza A by PCR: NEGATIVE
Influenza B by PCR: NEGATIVE
SARS Coronavirus 2 by RT PCR: NEGATIVE

## 2020-08-05 MED ORDER — SODIUM CHLORIDE 0.9% FLUSH: 3.0000 mL | INTRAVENOUS | Status: DC | PRN

## 2020-08-05 MED ORDER — PHENYLEPHRINE 40 MCG/ML (10ML) SYRINGE FOR IV PUSH (FOR BLOOD PRESSURE SUPPORT): 80.0000 ug | PREFILLED_SYRINGE | INTRAVENOUS | Status: DC | PRN

## 2020-08-05 MED ORDER — DIPHENHYDRAMINE HCL 50 MG/ML IJ SOLN: 12.5000 mg | INTRAMUSCULAR | Status: DC | PRN

## 2020-08-05 MED ORDER — SODIUM CHLORIDE (PF) 0.9 % IJ SOLN
INTRAMUSCULAR | Status: DC | PRN
  Administered 2020-08-05: 12 mL/h via EPIDURAL

## 2020-08-05 MED ORDER — OXYTOCIN BOLUS FROM INFUSION: 333.0000 mL | Freq: Once | INTRAVENOUS | Status: DC

## 2020-08-05 MED ORDER — LACTATED RINGERS IV SOLN: 500.0000 mL | INTRAVENOUS | Status: DC | PRN

## 2020-08-05 MED ORDER — LIDOCAINE HCL (PF) 1 % IJ SOLN: 30.0000 mL | INTRAMUSCULAR | Status: DC | PRN

## 2020-08-05 MED ORDER — SOD CITRATE-CITRIC ACID 500-334 MG/5ML PO SOLN
30.0000 mL | ORAL | Status: DC | PRN
  Administered 2020-08-06: 09:00:00 30 mL via ORAL
  Filled 2020-08-05: qty 15

## 2020-08-05 MED ORDER — SODIUM CHLORIDE 0.9 % IV SOLN: 250.0000 mL | INTRAVENOUS | Status: DC | PRN

## 2020-08-05 MED ORDER — SODIUM CHLORIDE 0.9% FLUSH: 3.0000 mL | Freq: Two times a day (BID) | INTRAVENOUS | Status: DC

## 2020-08-05 MED ORDER — LACTATED RINGERS IV SOLN: INTRAVENOUS | Status: DC

## 2020-08-05 MED ORDER — OXYTOCIN-SODIUM CHLORIDE 30-0.9 UT/500ML-% IV SOLN: 2.5000 [IU]/h | INTRAVENOUS | Status: DC

## 2020-08-05 MED ORDER — OXYTOCIN 10 UNIT/ML IJ SOLN: 10.0000 [IU] | Freq: Once | INTRAMUSCULAR | Status: DC

## 2020-08-05 MED ORDER — OXYTOCIN-SODIUM CHLORIDE 30-0.9 UT/500ML-% IV SOLN
1.0000 m[IU]/min | INTRAVENOUS | Status: DC
  Administered 2020-08-05: 10:00:00 2 m[IU]/min via INTRAVENOUS
  Filled 2020-08-05: qty 500

## 2020-08-05 MED ORDER — MISOPROSTOL 50MCG HALF TABLET
50.0000 ug | ORAL_TABLET | ORAL | Status: DC
  Administered 2020-08-05 (×2): 50 ug via ORAL
  Filled 2020-08-05 (×2): qty 1

## 2020-08-05 MED ORDER — FENTANYL CITRATE (PF) 100 MCG/2ML IJ SOLN
50.0000 ug | INTRAMUSCULAR | Status: DC | PRN
Start: 2020-08-05 — End: 2020-08-06
  Administered 2020-08-05: 50 ug via INTRAVENOUS
  Filled 2020-08-05: qty 2

## 2020-08-05 MED ORDER — TERBUTALINE SULFATE 1 MG/ML IJ SOLN: 0.2500 mg | Freq: Once | INTRAMUSCULAR | Status: DC | PRN

## 2020-08-05 MED ORDER — LACTATED RINGERS IV SOLN: 500.0000 mL | Freq: Once | INTRAVENOUS | Status: DC

## 2020-08-05 MED ORDER — ONDANSETRON HCL 4 MG/2ML IJ SOLN
4.0000 mg | Freq: Four times a day (QID) | INTRAMUSCULAR | Status: DC | PRN
  Administered 2020-08-05: 08:00:00 4 mg via INTRAVENOUS
  Filled 2020-08-05: qty 2

## 2020-08-05 MED ORDER — FENTANYL-BUPIVACAINE-NACL 0.5-0.125-0.9 MG/250ML-% EP SOLN
12.0000 mL/h | EPIDURAL | Status: DC | PRN
  Filled 2020-08-05: qty 250

## 2020-08-05 MED ORDER — EPHEDRINE 5 MG/ML INJ: 10.0000 mg | INTRAVENOUS | Status: DC | PRN

## 2020-08-05 MED ORDER — LIDOCAINE HCL (PF) 1 % IJ SOLN
INTRAMUSCULAR | Status: DC | PRN
  Administered 2020-08-05: 10 mL via EPIDURAL

## 2020-08-05 MED ORDER — ACETAMINOPHEN 500 MG PO TABS: 1000.0000 mg | ORAL_TABLET | Freq: Four times a day (QID) | ORAL | Status: DC | PRN

## 2020-08-05 NOTE — Plan of Care (Signed)
BP (!) 144/63   Pulse 77   Temp 97.9 F (36.6 C) (Oral)   Resp 18   Ht 5\' 1"  (154.9 cm)   Wt (!) 147.1 kg   LMP 11/18/2019   BMI 61.29 kg/m   Pt resting in bed s/p foley balloon insertion. Given PRN fentanyl and zofran for pain and nausea relief.   Problem: Coping: Goal: Ability to verbalize concerns and feelings about labor and delivery will improve Outcome: Progressing   Problem: Education: Goal: Ability to state and carry out methods to decrease the pain will improve Outcome: Progressing   Problem: Education: Goal: Ability to make informed decisions regarding treatment and plan of care will improve Outcome: Progressing

## 2020-08-05 NOTE — Progress Notes (Signed)
Tracy Chang is a 34 y.o. G2P0010 at [redacted]w[redacted]d by LMP admitted for IOL due to gHTN  Subjective: Resting in bed. Reports mild contractions that she is feeling in her back. [name], husband, at the bedside. Discussed the R/B/A of Foley balloon placement for cervical ripening and patient consents to procedure.   Objective: Vitals:   08/05/20 0013 08/05/20 0027 08/05/20 0102 08/05/20 0527  BP:  136/81 121/70 (!) 144/63  Pulse:  89 82 77  Resp:  18    Temp:  97.9 F (36.6 C)    TempSrc:  Oral    Weight: (!) 147.1 kg     Height: 5\' 1"  (1.549 m)       FHT:  FHR: 140 bpm, variability: moderate,  accelerations:  Present,  decelerations:  Absent UC:   irregular, every 3-8 minutes SVE:   Dilation: 3 Effacement (%): 50 Station: -3 Exam by:: [email or web address], CNM   Foley balloon placed without difficulty. 60cc of fluid instilled in balloon and taped to leg for traction.   Labs: Results for orders placed or performed during the hospital encounter of 08/05/20 (from the past 24 hour(s))  Type and screen [facility]     Status: None   Collection Time: 08/05/20 12:25 AM  Result Value Ref Range   ABO/RH(D) A POS    Antibody Screen NEG    Sample Expiration      08/08/2020,2359 Performed at [facility], [address], [place], [place]   CBC     Status: Abnormal   Collection Time: 08/05/20 12:31 AM  Result Value Ref Range   WBC 12.6 (H) 4.0 - 10.5 K/uL   RBC 3.83 (L) 3.87 - 5.11 MIL/uL   Hemoglobin 11.8 (L) 12.0 - 15.0 g/dL   HCT 08/07/20 (L) 62.7 - 03.5 %   MCV 88.5 80.0 - 100.0 fL   MCH 30.8 26.0 - 34.0 pg   MCHC 34.8 30.0 - 36.0 g/dL   RDW 00.9 38.1 - 82.9 %   Platelets 181 150 - 400 K/uL   nRBC 0.0 0.0 - 0.2 %  Comprehensive metabolic panel     Status: Abnormal   Collection Time: 08/05/20 12:31 AM  Result Value Ref Range   Sodium 137 135 - 145 mmol/L   Potassium 3.7 3.5 - 5.1 mmol/L   Chloride 103 98 - 111 mmol/L   CO2 23 22 - 32 mmol/L   Glucose, Bld 105  (H) 70 - 99 mg/dL   BUN 13 6 - 20 mg/dL   Creatinine, Ser 08/07/20 0.44 - 1.00 mg/dL   Calcium 9.6 8.9 - 1.69 mg/dL   Total Protein 5.9 (L) 6.5 - 8.1 g/dL   Albumin 2.7 (L) 3.5 - 5.0 g/dL   AST 17 15 - 41 U/L   ALT 15 0 - 44 U/L   Alkaline Phosphatase 108 38 - 126 U/L   Total Bilirubin 0.2 (L) 0.3 - 1.2 mg/dL   GFR, Estimated 67.8 >93 mL/min   Anion gap 11 5 - 15  Resp Panel by RT-PCR (Flu A&B, Covid) Nasopharyngeal Swab     Status: None   Collection Time: 08/05/20  1:00 AM   Specimen: Nasopharyngeal Swab; Nasopharyngeal(NP) swabs in vial transport medium  Result Value Ref Range   SARS Coronavirus 2 by RT PCR NEGATIVE NEGATIVE   Influenza A by PCR NEGATIVE NEGATIVE   Influenza B by PCR NEGATIVE NEGATIVE   Assessment / Plan: G2P0010 34 y.o. [redacted]w[redacted]d Induction of labor due  to gestational hypertension, s/p 1 dose of buccal Cytotec  Labor: Cervical ripening, buccal Cytotec and Foley balloon placed Preeclampsia:  no signs or symptoms of toxicity and labs stable Fetal Wellbeing:  Category I Pain Control:  Labor support without medications I/D:  GBS negative Anticipated MOD:  NSVD   Repeat Cytotec buccally. Will recheck SVE in 4 hours or earlier if necessary.   June Leap, CNM, MSN 08/05/2020, 5:29 AM

## 2020-08-05 NOTE — Anesthesia Preprocedure Evaluation (Signed)
Anesthesia Evaluation  Patient identified by MRN, date of birth, ID band Patient awake    Reviewed: Allergy & Precautions, NPO status , Patient's Chart, lab work & pertinent test results  Airway Mallampati: IV       Dental no notable dental hx.    Pulmonary asthma ,    Pulmonary exam normal        Cardiovascular hypertension, Pt. on home beta blockers Normal cardiovascular exam     Neuro/Psych negative neurological ROS     GI/Hepatic negative GI ROS, Neg liver ROS,   Endo/Other  Morbid obesity  Renal/GU negative Renal ROS  negative genitourinary   Musculoskeletal negative musculoskeletal ROS (+)   Abdominal (+) + obese,   Peds  Hematology  (+) anemia ,   Anesthesia Other Findings   Reproductive/Obstetrics (+) Pregnancy                             Anesthesia Physical Anesthesia Plan  ASA: III  Anesthesia Plan: Epidural   Post-op Pain Management:    Induction:   PONV Risk Score and Plan:   Airway Management Planned:   Additional Equipment:   Intra-op Plan:   Post-operative Plan:   Informed Consent: I have reviewed the patients History and Physical, chart, labs and discussed the procedure including the risks, benefits and alternatives for the proposed anesthesia with the patient or authorized representative who has indicated his/her understanding and acceptance.       Plan Discussed with: CRNA  Anesthesia Plan Comments:         Anesthesia Quick Evaluation

## 2020-08-05 NOTE — H&P (Addendum)
OB ADMISSION/ HISTORY & PHYSICAL:  Admission Date: 08/05/2020 12:10 AM  Admit Diagnosis: Gestational hypertension [O13.9]    Tracy Chang is a 34 y.o. female presenting for IOL for gestational hypertension. Reports occasional contractions. Denies leaking of fluid or vaginal bleeding. Endorses + fetal movement. Denies headache, epigastric pain, or visual changes. Husband, Tracy Chang, at the bedside for support. Mother and doula, [name], will join support team in the morning. Eagerly anticipating baby boy, Tracy Chang.   Prenatal History: G2P0010   EDC : 08/24/2020, by Last Menstrual Period  Prenatal care at [facility] since 17 weeks, primary A. Yetta Barre CNM, transfer from [facility]  Prenatal course complicated by: 1. BMI 61, ASA daily 2. Gestational hypertension, diagnosed at 33 weeks, on Labetalol 200mg  TID, negative PEC labs on 12/13 in office 3. Betamethasone complete, 12/2 and 12/3, at diagnosis of gHTN at 33 weeks per MD consult 4. LGA, last growth at 36 weeks 86%, 33 weeks 98% 5. Asthma, prn albuterol inhaler 6. GERD, on Pepcid 20mg  7. Depression and anxiety, on Zoloft 50mg  daily, stable  Prenatal Labs: ABO, Rh:   A POS Antibody:   Negative Rubella:   Immune RPR:   Non-reactive HBsAg:   Negative HIV:   Negative GBS:   Negative 1 hr Glucola : 118 Genetic Screening: NT WNL, AFP-1 negative, declined Panorama Ultrasound: normal XY anatomy, vertex, anterior placenta, AFI 11.5cm, growth at 36 weeks 3303gm, 86%  TDaP         UTD Flu             UTD COVID-19 UTD    Maternal Diabetes: No Genetic Screening: Normal Maternal Ultrasounds/Referrals: Normal Fetal Ultrasounds or other Referrals:  None Maternal Substance Abuse:  No Significant Maternal Medications:  Meds include: Zoloft Other: Labetalol Significant Maternal Lab Results:  Group B Strep negative Other Comments:  None  Medical / Surgical History : Past medical history:  Past Medical History:  Diagnosis Date  . Anxiety   .  Asthma   . Complication of anesthesia    Conscious sedation-extreme agitation  . Pregnancy induced hypertension     Past surgical history:  Past Surgical History:  Procedure Laterality Date  . CHOLECYSTECTOMY    . TONSILLECTOMY      Family History:  Family History  Problem Relation Age of Onset  . Hypertension Mother     Social History:  reports that she has never smoked. She has never used smokeless tobacco. She reports previous alcohol use. She reports that she does not use drugs.  Allergies: Augmentin [amoxicillin-pot clavulanate], Ceclor [cefaclor], Demerol [meperidine], Latex, Versed [midazolam], and Compazine [prochlorperazine]   Current Medications at time of admission:  Medications Prior to Admission  Medication Sig Dispense Refill Last Dose  . acetaminophen (TYLENOL) 500 MG tablet Take 1,000 mg by mouth every 6 (six) hours as needed.   08/04/2020 at Unknown time  . albuterol (VENTOLIN HFA) 108 (90 Base) MCG/ACT inhaler Inhale 1-2 puffs into the lungs every 6 (six) hours as needed for wheezing or shortness of breath.   Past Week at Unknown time  . labetalol (NORMODYNE) 100 MG tablet Take 100 mg by mouth in the morning, at noon, and at bedtime.   08/04/2020 at Unknown time  . Prenatal Vit-Fe Fumarate-FA (PRENATAL MULTIVITAMIN) TABS tablet Take 1 tablet by mouth daily at 12 noon.   08/04/2020 at Unknown time    Review of Systems: Review of Systems  Constitutional: Negative for chills and fever.  HENT: Negative for congestion and sore throat.  Eyes: Negative for blurred vision.  Respiratory: Positive for shortness of breath. Negative for cough.   Cardiovascular: Positive for leg swelling. Negative for chest pain.  Gastrointestinal: Positive for heartburn. Negative for abdominal pain, nausea and vomiting.  Musculoskeletal: Negative for back pain and falls.  Neurological: Negative for dizziness and headaches.  Psychiatric/Behavioral: Negative for depression.   Physical  Exam: Vital signs and nursing notes reviewed.  Patient Vitals for the past 24 hrs:  BP Pulse Height Weight  08/05/20 0102 121/70 82 -- --  08/05/20 0013 -- -- 5\' 1"  (1.549 m) (!) 147.1 kg    General: AAO x 3, NAD Heart: RRR Lungs:CTAB Abdomen: Gravid, NT, Leopold's vertex Extremities: 2+ non-pitting edema to lower extremities Genitalia / VE: Dilation: 3 Effacement (%): 50 Station: -3 Presentation: Vertex Exam by:: [email or web address], CNM   FHR: 140BPM, mod variability, + accels, no decels TOCO: Contractions occasional  Labs:   Pending T&S, CBC, RPR  Recent Labs    08/05/20 0031  WBC 12.6*  HGB 11.8*  HCT 33.9*  PLT 181   Assessment:  35 y.o. G2P0010 at [redacted]w[redacted]d, gestational hypertension  1. IOL for gHTN 2. FHR category 1 3. GBS negative 4. Desires epidural 5. Plans to breastfeed 6. Desires inpatient circumcision 7. Gestational hypertension on PO Labetalol 200mg  TID 8. Asthma, stable on albuterol inhaler prn 9. LGA, last growth 86% at 36 weeks 10. Anxiety and depression, stable on Zoloft 11. Betamethasone complete, 12/2 and 12/3  Plan:  1. Admit to BS 2. Routine L&D orders 3. Analgesia/anesthesia PRN  4. Cytotec 14/2 buccal every 4 hours for cervical ripening. Will place Foley balloon when able. 5. Will check PEC labs on admission, BP stable 6. Labetalol protocol for severe range blood pressures  7. Anticipate NSVB 8. Close PP follow-up for BP and PPD  Dr. 14/3 notified of admission/plan of care.  CNM, MSN 08/05/2020, 1:14 AM

## 2020-08-05 NOTE — Anesthesia Procedure Notes (Signed)
Epidural Patient location during procedure: OB Start time: 08/05/2020 7:07 PM End time: 08/05/2020 7:10 PM  Staffing Anesthesiologist: Leilani Able, MD Performed: anesthesiologist   Preanesthetic Checklist Completed: patient identified, IV checked, site marked, risks and benefits discussed, surgical consent, monitors and equipment checked, pre-op evaluation and timeout performed  Epidural Patient position: sitting Prep: DuraPrep and site prepped and draped Patient monitoring: continuous pulse ox and blood pressure Approach: midline Location: L3-L4 Injection technique: LOR air  Needle:  Needle type: Tuohy  Needle gauge: 17 G Needle length: 9 cm and 9 Needle insertion depth: 9 cm Catheter type: closed end flexible Catheter size: 19 Gauge Catheter at skin depth: 15 cm Test dose: negative and Other  Assessment Events: blood not aspirated, injection not painful, no injection resistance, no paresthesia and negative IV test  Additional Notes Reason for block:procedure for pain

## 2020-08-05 NOTE — Progress Notes (Signed)
Tracy Chang is a 34 y.o. G2P0010 at [redacted]w[redacted]d by LMP admitted for IOL for gHTN  Subjective: Relieved to have Foley balloon out. Reports feeling much better. Reports mild contractions. Husband, [name], mother, and doula, [name], at the bedside providing support.   Objective: Vitals:   08/05/20 0013 08/05/20 0027 08/05/20 0102 08/05/20 0527  BP:  136/81 121/70 (!) 144/63  Pulse:  89 82 77  Resp:  18    Temp:  97.9 F (36.6 C)    TempSrc:  Oral    Weight: (!) 147.1 kg     Height: 5\' 1"  (1.549 m)       FHT:  FHR: 140 bpm, variability: moderate,  accelerations:  Present,  decelerations:  Absent UC:   irregular SVE:   Dilation: 4.5 Effacement (%): 60 Station: -3 Exam by:: [email or web address] CNM   Labs: Results for orders placed or performed during the hospital encounter of 08/05/20 (from the past 24 hour(s))  Type and screen [facility]     Status: None   Collection Time: 08/05/20 12:25 AM  Result Value Ref Range   ABO/RH(D) A POS    Antibody Screen NEG    Sample Expiration      08/08/2020,2359 Performed at [facility], [address], [place], [place]   CBC     Status: Abnormal   Collection Time: 08/05/20 12:31 AM  Result Value Ref Range   WBC 12.6 (H) 4.0 - 10.5 K/uL   RBC 3.83 (L) 3.87 - 5.11 MIL/uL   Hemoglobin 11.8 (L) 12.0 - 15.0 g/dL   HCT 08/07/20 (L) 31.5 - 40.0 %   MCV 88.5 80.0 - 100.0 fL   MCH 30.8 26.0 - 34.0 pg   MCHC 34.8 30.0 - 36.0 g/dL   RDW 86.7 61.9 - 50.9 %   Platelets 181 150 - 400 K/uL   nRBC 0.0 0.0 - 0.2 %  Comprehensive metabolic panel     Status: Abnormal   Collection Time: 08/05/20 12:31 AM  Result Value Ref Range   Sodium 137 135 - 145 mmol/L   Potassium 3.7 3.5 - 5.1 mmol/L   Chloride 103 98 - 111 mmol/L   CO2 23 22 - 32 mmol/L   Glucose, Bld 105 (H) 70 - 99 mg/dL   BUN 13 6 - 20 mg/dL   Creatinine, Ser 08/07/20 0.44 - 1.00 mg/dL   Calcium 9.6 8.9 - 7.12 mg/dL   Total Protein 5.9 (L) 6.5 - 8.1 g/dL   Albumin 2.7 (L)  3.5 - 5.0 g/dL   AST 17 15 - 41 U/L   ALT 15 0 - 44 U/L   Alkaline Phosphatase 108 38 - 126 U/L   Total Bilirubin 0.2 (L) 0.3 - 1.2 mg/dL   GFR, Estimated 45.8 >09 mL/min   Anion gap 11 5 - 15  Resp Panel by RT-PCR (Flu A&B, Covid) Nasopharyngeal Swab     Status: None   Collection Time: 08/05/20  1:00 AM   Specimen: Nasopharyngeal Swab; Nasopharyngeal(NP) swabs in vial transport medium  Result Value Ref Range   SARS Coronavirus 2 by RT PCR NEGATIVE NEGATIVE   Influenza A by PCR NEGATIVE NEGATIVE   Influenza B by PCR NEGATIVE NEGATIVE   Assessment / Plan: G2P0010 34 y.o. [redacted]w[redacted]d Induction of labor due to gestational hypertension, s/p 2 doses of buccal Cytotec and Foley balloon  Labor: Progressing normally Preeclampsia:  no signs or symptoms of toxicity and labs stable Fetal Wellbeing:  Category I  Pain Control:  IV pain meds I/D:  GBS negative Anticipated MOD:  NSVD   Will start Pitocin 2x2 until adequate contractions. Will consider AROM with adequate contractions and descent of fetal vertex. Will place internal monitoring when appropriate. Patient may have epidural upon request.   Clancy Gourd, MSN 08/05/2020, 9:40 AM

## 2020-08-05 NOTE — Progress Notes (Signed)
Tracy Chang is a 34 y.o. G2P0010 at [redacted]w[redacted]d by LMP admitted for IOL for gHTN  Subjective: Comfortable with epidural. Husband, Tracy Chang, mother, and doula, Tracy Chang, at the bedside providing support. Discussed the R/B/A of IUPC and FSE for labor monitoring and patient consents to procedure.   Objective: Vitals with BMI 08/05/2020 08/05/2020 08/05/2020  Height - - -  Weight - - -  BMI - - -  Systolic 118 121 756  Diastolic 58 52 77  Pulse 79 71 75   FHT:  FHR: 140 bpm, variability: moderate,  accelerations:  Present,  decelerations:  Absent UC:   irregular SVE:   Dilation: 5 Effacement (%): 70 Station: -2 Exam by:: Dorisann Frames CNM   IUPC and FSE placed without difficulty.  Assessment / Plan: G2P0010 34 y.o. [redacted]w[redacted]d Induction of labor due to gestational hypertension, Pitocin infusing at 37mu  Labor: Progressing towards active stage of labor Preeclampsia:  no signs or symptoms of toxicity and labs stable Fetal Wellbeing:  Category I Pain Control:  Epidural I/D:  GBS negative Anticipated MOD:  NSVD   Continue to titrate Pitocin until adequate MVUs. Will recheck SVE in 4-6 hours or sooner if necessary.   Dr. Billy Coast updated on patient status and plan of care.   June Leap, CNM, MSN 08/05/2020, 9:16 PM

## 2020-08-05 NOTE — Progress Notes (Signed)
Tracy Chang is a 34 y.o. G2P0010 at [redacted]w[redacted]d by LMP admitted for IOL for gHTN  Subjective: Reports mild cramping and intermittent leaking of a small amount of fluid. Husband, [name], mother, and doula, [name], at the bedside providing support. Discussed the R/B/A of AROM for labor induction and patient consents to procedure.   Objective: Vitals with BMI 08/05/2020 08/05/2020 08/05/2020  Height - - -  Weight - - -  BMI - - -  Systolic 144 137 161  Diastolic 83 73 63  Pulse 81 85 77   FHT:  FHR: 140 bpm, variability: moderate,  accelerations:  Present,  decelerations:  Absent UC:   irregular SVE:   Dilation: 4 Effacement (%): 50 Station: -2 Exam by:: Izabel Chim CNM   AROM of copious amount of clear fluid at 1600  Assessment / Plan: G2P0010 34 y.o. [redacted]w[redacted]d Induction of labor due to gestational hypertension, Pitocin infusing at 68mu  Labor: Working towards active labor Preeclampsia:  no signs or symptoms of toxicity and labs stable Fetal Wellbeing:  Category I Pain Control:  IV pain meds I/D:  GBS negative Anticipated MOD:  NSVD   Continue to titrate Pitocin until adequate contractions. Patient may have epidural upon request. Will place internal monitoring after epidural.   June Leap, CNM, MSN 08/05/2020, 4:53 PM

## 2020-08-06 ENCOUNTER — Encounter (HOSPITAL_COMMUNITY)
Admission: AD | Disposition: A | Discharge status: Home / Self Care | Payer: Self-pay | Source: Home / Self Care | Attending: Obstetrics and Gynecology

## 2020-08-06 ENCOUNTER — Encounter (HOSPITAL_COMMUNITY): Payer: Self-pay | Admitting: Obstetrics and Gynecology

## 2020-08-06 DIAGNOSIS — O134 Gestational [pregnancy-induced] hypertension without significant proteinuria, complicating childbirth: Secondary | ICD-10-CM | POA: Diagnosis not present

## 2020-08-06 DIAGNOSIS — O9952 Diseases of the respiratory system complicating childbirth: Secondary | ICD-10-CM | POA: Diagnosis not present

## 2020-08-06 DIAGNOSIS — J45909 Unspecified asthma, uncomplicated: Secondary | ICD-10-CM | POA: Diagnosis present

## 2020-08-06 DIAGNOSIS — O3663X0 Maternal care for excessive fetal growth, third trimester, not applicable or unspecified: Secondary | ICD-10-CM | POA: Diagnosis not present

## 2020-08-06 DIAGNOSIS — Z98891 History of uterine scar from previous surgery: Secondary | ICD-10-CM

## 2020-08-06 DIAGNOSIS — F419 Anxiety disorder, unspecified: Secondary | ICD-10-CM | POA: Diagnosis present

## 2020-08-06 DIAGNOSIS — O9962 Diseases of the digestive system complicating childbirth: Secondary | ICD-10-CM | POA: Diagnosis not present

## 2020-08-06 DIAGNOSIS — D62 Acute posthemorrhagic anemia: Secondary | ICD-10-CM | POA: Diagnosis not present

## 2020-08-06 DIAGNOSIS — K219 Gastro-esophageal reflux disease without esophagitis: Secondary | ICD-10-CM | POA: Diagnosis not present

## 2020-08-06 DIAGNOSIS — Z20822 Contact with and (suspected) exposure to covid-19: Secondary | ICD-10-CM | POA: Diagnosis not present

## 2020-08-06 LAB — CBC
HCT: 28.7 % — ABNORMAL LOW (ref 36.0–46.0)
Hemoglobin: 9.8 g/dL — ABNORMAL LOW (ref 12.0–15.0)
MCH: 29.9 pg (ref 26.0–34.0)
MCHC: 34.1 g/dL (ref 30.0–36.0)
MCV: 87.5 fL (ref 80.0–100.0)
Platelets: 186 10*3/uL (ref 150–400)
RBC: 3.28 MIL/uL — ABNORMAL LOW (ref 3.87–5.11)
RDW: 13.2 % (ref 11.5–15.5)
WBC: 18 10*3/uL — ABNORMAL HIGH (ref 4.0–10.5)
nRBC: 0 % (ref 0.0–0.2)

## 2020-08-06 SURGERY — Surgical Case
Anesthesia: Epidural

## 2020-08-06 MED ORDER — NALOXONE HCL 4 MG/10ML IJ SOLN
1.0000 ug/kg/h | INTRAVENOUS | Status: DC | PRN
  Filled 2020-08-06: qty 5

## 2020-08-06 MED ORDER — PHENYLEPHRINE HCL (PRESSORS) 10 MG/ML IV SOLN
INTRAVENOUS | Status: DC | PRN
  Administered 2020-08-06 (×2): 80 ug via INTRAVENOUS
  Administered 2020-08-06: 8080 ug via INTRAVENOUS
  Administered 2020-08-06: 80 ug via INTRAVENOUS

## 2020-08-06 MED ORDER — ACETAMINOPHEN 500 MG PO TABS
1000.0000 mg | ORAL_TABLET | Freq: Four times a day (QID) | ORAL | Status: AC
  Administered 2020-08-06 – 2020-08-07 (×2): 1000 mg via ORAL
  Filled 2020-08-06 (×3): qty 2

## 2020-08-06 MED ORDER — ONDANSETRON HCL 4 MG/2ML IJ SOLN
INTRAMUSCULAR | Status: DC | PRN
  Administered 2020-08-06: 4 mg via INTRAVENOUS

## 2020-08-06 MED ORDER — IBUPROFEN 800 MG PO TABS: 800.0000 mg | ORAL_TABLET | Freq: Four times a day (QID) | ORAL | Status: DC

## 2020-08-06 MED ORDER — NALBUPHINE HCL 10 MG/ML IJ SOLN
5.0000 mg | INTRAMUSCULAR | Status: DC | PRN
  Administered 2020-08-06: 5 mg via INTRAVENOUS
  Filled 2020-08-06: qty 1

## 2020-08-06 MED ORDER — DIPHENHYDRAMINE HCL 25 MG PO CAPS
25.0000 mg | ORAL_CAPSULE | Freq: Four times a day (QID) | ORAL | Status: DC | PRN
Start: 2020-08-06 — End: 2020-08-10
  Filled 2020-08-06: qty 1

## 2020-08-06 MED ORDER — SCOPOLAMINE 1 MG/3DAYS TD PT72
MEDICATED_PATCH | TRANSDERMAL | Status: AC
  Filled 2020-08-06: qty 1

## 2020-08-06 MED ORDER — DEXTROSE 5 % IV SOLN
INTRAVENOUS | Status: DC | PRN
  Administered 2020-08-06: 09:00:00 3 g via INTRAVENOUS

## 2020-08-06 MED ORDER — ONDANSETRON HCL 4 MG/2ML IJ SOLN: 4.0000 mg | Freq: Once | INTRAMUSCULAR | Status: DC | PRN

## 2020-08-06 MED ORDER — SCOPOLAMINE 1 MG/3DAYS TD PT72
MEDICATED_PATCH | TRANSDERMAL | Status: DC | PRN
  Administered 2020-08-06: 1 via TRANSDERMAL

## 2020-08-06 MED ORDER — DIPHENHYDRAMINE HCL 25 MG PO CAPS
25.0000 mg | ORAL_CAPSULE | ORAL | Status: DC | PRN
  Administered 2020-08-07 (×2): 25 mg via ORAL
  Filled 2020-08-06: qty 1

## 2020-08-06 MED ORDER — OXYCODONE-ACETAMINOPHEN 5-325 MG PO TABS
1.0000 | ORAL_TABLET | ORAL | Status: DC | PRN
  Administered 2020-08-07 (×2): 2 via ORAL
  Administered 2020-08-07 (×2): 1 via ORAL
  Administered 2020-08-08 – 2020-08-09 (×8): 2 via ORAL
  Filled 2020-08-06: qty 2
  Filled 2020-08-06 (×2): qty 1
  Filled 2020-08-06 (×9): qty 2

## 2020-08-06 MED ORDER — ACETAMINOPHEN 10 MG/ML IV SOLN
INTRAVENOUS | Status: AC
  Filled 2020-08-06: qty 100

## 2020-08-06 MED ORDER — LACTATED RINGERS IV SOLN: INTRAVENOUS | Status: DC

## 2020-08-06 MED ORDER — SIMETHICONE 80 MG PO CHEW: 80.0000 mg | CHEWABLE_TABLET | ORAL | Status: DC | PRN

## 2020-08-06 MED ORDER — OXYTOCIN-SODIUM CHLORIDE 30-0.9 UT/500ML-% IV SOLN
INTRAVENOUS | Status: DC | PRN
  Administered 2020-08-06: 300 mL via INTRAVENOUS

## 2020-08-06 MED ORDER — METHYLERGONOVINE MALEATE 0.2 MG/ML IJ SOLN: 0.2000 mg | INTRAMUSCULAR | Status: DC | PRN

## 2020-08-06 MED ORDER — ONDANSETRON HCL 4 MG/2ML IJ SOLN: 4.0000 mg | Freq: Three times a day (TID) | INTRAMUSCULAR | Status: DC | PRN

## 2020-08-06 MED ORDER — SCOPOLAMINE 1 MG/3DAYS TD PT72: 1.0000 | MEDICATED_PATCH | Freq: Once | TRANSDERMAL | Status: DC

## 2020-08-06 MED ORDER — NALBUPHINE HCL 10 MG/ML IJ SOLN: 5.0000 mg | INTRAMUSCULAR | Status: DC | PRN

## 2020-08-06 MED ORDER — ZOLPIDEM TARTRATE 5 MG PO TABS: 5.0000 mg | ORAL_TABLET | Freq: Every evening | ORAL | Status: DC | PRN

## 2020-08-06 MED ORDER — ONDANSETRON HCL 4 MG/2ML IJ SOLN
INTRAMUSCULAR | Status: AC
  Filled 2020-08-06: qty 2

## 2020-08-06 MED ORDER — NALBUPHINE HCL 10 MG/ML IJ SOLN: 5.0000 mg | Freq: Once | INTRAMUSCULAR | Status: DC | PRN

## 2020-08-06 MED ORDER — METOCLOPRAMIDE HCL 5 MG/ML IJ SOLN
INTRAMUSCULAR | Status: AC
  Filled 2020-08-06: qty 2

## 2020-08-06 MED ORDER — SIMETHICONE 80 MG PO CHEW
80.0000 mg | CHEWABLE_TABLET | Freq: Three times a day (TID) | ORAL | Status: DC
  Administered 2020-08-06 – 2020-08-09 (×7): 80 mg via ORAL
  Filled 2020-08-06 (×7): qty 1

## 2020-08-06 MED ORDER — NALOXONE HCL 0.4 MG/ML IJ SOLN: 0.4000 mg | INTRAMUSCULAR | Status: DC | PRN

## 2020-08-06 MED ORDER — LABETALOL HCL 200 MG PO TABS
200.0000 mg | ORAL_TABLET | Freq: Three times a day (TID) | ORAL | Status: DC
  Administered 2020-08-06 (×2): 200 mg via ORAL
  Filled 2020-08-06 (×3): qty 1

## 2020-08-06 MED ORDER — TETANUS-DIPHTH-ACELL PERTUSSIS 5-2.5-18.5 LF-MCG/0.5 IM SUSY: 0.5000 mL | PREFILLED_SYRINGE | Freq: Once | INTRAMUSCULAR | Status: DC

## 2020-08-06 MED ORDER — FENTANYL CITRATE (PF) 100 MCG/2ML IJ SOLN: 25.0000 ug | INTRAMUSCULAR | Status: DC | PRN

## 2020-08-06 MED ORDER — FENTANYL CITRATE (PF) 100 MCG/2ML IJ SOLN
INTRAMUSCULAR | Status: AC
  Filled 2020-08-06: qty 2

## 2020-08-06 MED ORDER — METHYLERGONOVINE MALEATE 0.2 MG PO TABS: 0.2000 mg | ORAL_TABLET | ORAL | Status: DC | PRN

## 2020-08-06 MED ORDER — KETOROLAC TROMETHAMINE 30 MG/ML IJ SOLN
INTRAMUSCULAR | Status: AC
  Filled 2020-08-06: qty 1

## 2020-08-06 MED ORDER — OXYTOCIN-SODIUM CHLORIDE 30-0.9 UT/500ML-% IV SOLN: 2.5000 [IU]/h | INTRAVENOUS | Status: AC

## 2020-08-06 MED ORDER — MORPHINE SULFATE (PF) 10 MG/ML IV SOLN
INTRAVENOUS | Status: DC | PRN
  Administered 2020-08-06: 3 mg via EPIDURAL

## 2020-08-06 MED ORDER — PRENATAL MULTIVITAMIN CH
1.0000 | ORAL_TABLET | Freq: Every day | ORAL | Status: DC
  Administered 2020-08-06 – 2020-08-08 (×3): 1 via ORAL
  Filled 2020-08-06 (×4): qty 1

## 2020-08-06 MED ORDER — MENTHOL 3 MG MT LOZG: 1.0000 | LOZENGE | OROMUCOSAL | Status: DC | PRN

## 2020-08-06 MED ORDER — SENNOSIDES-DOCUSATE SODIUM 8.6-50 MG PO TABS
2.0000 | ORAL_TABLET | Freq: Every day | ORAL | Status: DC
  Administered 2020-08-07: 15:00:00 2 via ORAL
  Filled 2020-08-06 (×2): qty 2

## 2020-08-06 MED ORDER — DIPHENHYDRAMINE HCL 50 MG/ML IJ SOLN: 12.5000 mg | INTRAMUSCULAR | Status: DC | PRN

## 2020-08-06 MED ORDER — MORPHINE SULFATE (PF) 0.5 MG/ML IJ SOLN
INTRAMUSCULAR | Status: AC
  Filled 2020-08-06: qty 10

## 2020-08-06 MED ORDER — METOCLOPRAMIDE HCL 5 MG/ML IJ SOLN
INTRAMUSCULAR | Status: DC | PRN
  Administered 2020-08-06 (×2): 5 mg via INTRAVENOUS

## 2020-08-06 MED ORDER — LACTATED RINGERS IV BOLUS
500.0000 mL | Freq: Once | INTRAVENOUS | Status: AC
  Administered 2020-08-06: 17:00:00 500 mL via INTRAVENOUS

## 2020-08-06 MED ORDER — LACTATED RINGERS IV SOLN: INTRAVENOUS | Status: DC | PRN

## 2020-08-06 MED ORDER — BUPIVACAINE HCL (PF) 0.25 % IJ SOLN
INTRAMUSCULAR | Status: DC | PRN
  Administered 2020-08-06: 30 mL

## 2020-08-06 MED ORDER — LABETALOL HCL 100 MG PO TABS
100.0000 mg | ORAL_TABLET | Freq: Two times a day (BID) | ORAL | Status: DC
Start: 2020-08-06 — End: 2020-08-06

## 2020-08-06 MED ORDER — COCONUT OIL OIL
1.0000 "application " | TOPICAL_OIL | Status: DC | PRN
  Administered 2020-08-07: 1 via TOPICAL

## 2020-08-06 MED ORDER — KETOROLAC TROMETHAMINE 30 MG/ML IJ SOLN
30.0000 mg | Freq: Four times a day (QID) | INTRAMUSCULAR | Status: DC | PRN
  Administered 2020-08-06: 30 mg via INTRAMUSCULAR

## 2020-08-06 MED ORDER — OXYTOCIN-SODIUM CHLORIDE 30-0.9 UT/500ML-% IV SOLN
INTRAVENOUS | Status: AC
  Filled 2020-08-06: qty 500

## 2020-08-06 MED ORDER — BUPIVACAINE HCL (PF) 0.25 % IJ SOLN
INTRAMUSCULAR | Status: AC
  Filled 2020-08-06: qty 30

## 2020-08-06 MED ORDER — SODIUM CHLORIDE 0.9% FLUSH: 3.0000 mL | INTRAVENOUS | Status: DC | PRN

## 2020-08-06 MED ORDER — DEXAMETHASONE SODIUM PHOSPHATE 4 MG/ML IJ SOLN
INTRAMUSCULAR | Status: AC
  Filled 2020-08-06: qty 1

## 2020-08-06 MED ORDER — ACETAMINOPHEN 10 MG/ML IV SOLN
1000.0000 mg | Freq: Once | INTRAVENOUS | Status: DC | PRN
  Administered 2020-08-06: 1000 mg via INTRAVENOUS

## 2020-08-06 MED ORDER — KETOROLAC TROMETHAMINE 30 MG/ML IJ SOLN: 30.0000 mg | Freq: Four times a day (QID) | INTRAMUSCULAR | Status: DC | PRN

## 2020-08-06 MED ORDER — DIBUCAINE (PERIANAL) 1 % EX OINT: 1.0000 "application " | TOPICAL_OINTMENT | CUTANEOUS | Status: DC | PRN

## 2020-08-06 MED ORDER — WITCH HAZEL-GLYCERIN EX PADS: 1.0000 "application " | MEDICATED_PAD | CUTANEOUS | Status: DC | PRN

## 2020-08-06 MED ORDER — KETOROLAC TROMETHAMINE 30 MG/ML IJ SOLN
30.0000 mg | Freq: Four times a day (QID) | INTRAMUSCULAR | Status: DC
  Administered 2020-08-06: 17:00:00 30 mg via INTRAVENOUS
  Filled 2020-08-06: qty 1

## 2020-08-06 MED ORDER — SODIUM BICARBONATE 8.4 % IV SOLN
INTRAVENOUS | Status: DC | PRN
  Administered 2020-08-06: 8 mL via EPIDURAL
  Administered 2020-08-06: 2 mL via EPIDURAL

## 2020-08-06 MED ORDER — DEXAMETHASONE SODIUM PHOSPHATE 4 MG/ML IJ SOLN
INTRAMUSCULAR | Status: DC | PRN
  Administered 2020-08-06: 8 mg via INTRAVENOUS

## 2020-08-06 SURGICAL SUPPLY — 44 items
BENZOIN TINCTURE PRP APPL 2/3 (GAUZE/BANDAGES/DRESSINGS) ×3 IMPLANT
CANISTER PREVENA PLUS 150 (CANNISTER) ×3 IMPLANT
CHLORAPREP W/TINT 26ML (MISCELLANEOUS) ×3 IMPLANT
CLAMP CORD UMBIL (MISCELLANEOUS) IMPLANT
CLOSURE STERI STRIP 1/2 X4 (GAUZE/BANDAGES/DRESSINGS) ×3 IMPLANT
CLOTH BEACON ORANGE TIMEOUT ST (SAFETY) ×3 IMPLANT
DRESSING PREVENA PLUS CUSTOM (GAUZE/BANDAGES/DRESSINGS) ×2 IMPLANT
DRSG OPSITE POSTOP 4X10 (GAUZE/BANDAGES/DRESSINGS) ×3 IMPLANT
DRSG PREVENA PLUS CUSTOM (GAUZE/BANDAGES/DRESSINGS) ×6
ELECT REM PT RETURN 9FT ADLT (ELECTROSURGICAL) ×3
ELECTRODE REM PT RTRN 9FT ADLT (ELECTROSURGICAL) ×1 IMPLANT
EXTRACTOR VACUUM M CUP 4 TUBE (SUCTIONS) IMPLANT
EXTRACTOR VACUUM M CUP 4' TUBE (SUCTIONS)
GLOVE BIO SURGEON STRL SZ7.5 (GLOVE) ×3 IMPLANT
GLOVE BIOGEL PI IND STRL 7.0 (GLOVE) ×1 IMPLANT
GLOVE BIOGEL PI INDICATOR 7.0 (GLOVE) ×2
GOWN STRL REUS W/TWL LRG LVL3 (GOWN DISPOSABLE) ×6 IMPLANT
KIT ABG SYR 3ML LUER SLIP (SYRINGE) IMPLANT
NDL SAFETY ECLIPSE 18X1.5 (NEEDLE) ×1 IMPLANT
NEEDLE HYPO 18GX1.5 SHARP (NEEDLE) ×2
NEEDLE HYPO 22GX1.5 SAFETY (NEEDLE) ×3 IMPLANT
NEEDLE HYPO 25X5/8 SAFETYGLIDE (NEEDLE) IMPLANT
NEEDLE KEITH (NEEDLE) ×3 IMPLANT
NEEDLE SPNL 20GX3.5 QUINCKE YW (NEEDLE) IMPLANT
NS IRRIG 1000ML POUR BTL (IV SOLUTION) ×3 IMPLANT
PACK C SECTION WH (CUSTOM PROCEDURE TRAY) ×3 IMPLANT
PENCIL SMOKE EVAC W/HOLSTER (ELECTROSURGICAL) ×3 IMPLANT
RETRACTOR TRAXI PANNICULUS (MISCELLANEOUS) ×1 IMPLANT
SUT MNCRL 0 VIOLET CTX 36 (SUTURE) ×2 IMPLANT
SUT MNCRL AB 3-0 PS2 27 (SUTURE) IMPLANT
SUT MON AB 2-0 CT1 27 (SUTURE) ×3 IMPLANT
SUT MON AB-0 CT1 36 (SUTURE) ×6 IMPLANT
SUT MONOCRYL 0 CTX 36 (SUTURE) ×4
SUT PLAIN 0 NONE (SUTURE) IMPLANT
SUT PLAIN 2 0 (SUTURE)
SUT PLAIN 2 0 XLH (SUTURE) IMPLANT
SUT PLAIN ABS 2-0 CT1 27XMFL (SUTURE) IMPLANT
SYR 20CC LL (SYRINGE) IMPLANT
SYR 30ML LL (SYRINGE) ×3 IMPLANT
SYR CONTROL 10ML LL (SYRINGE) ×3 IMPLANT
TOWEL OR 17X24 6PK STRL BLUE (TOWEL DISPOSABLE) ×3 IMPLANT
TRAXI PANNICULUS RETRACTOR (MISCELLANEOUS) ×2
TRAY FOLEY W/BAG SLVR 14FR LF (SET/KITS/TRAYS/PACK) ×3 IMPLANT
WATER STERILE IRR 1000ML POUR (IV SOLUTION) ×3 IMPLANT

## 2020-08-06 NOTE — Op Note (Signed)
Cesarean Section Procedure Note  Indications: failure to progress: arrest of dilation  Pre-operative Diagnosis: 39 week 0 day pregnancy.  Post-operative Diagnosis: same  Surgeon: Lenoard Aden   Assistants: Yetta Barre, CNM  Anesthesia: Epidural anesthesia and Local anesthesia 0.25.% bupivacaine  ASA Class: 2  Procedure Details  The patient was seen in the Holding Room. The risks, benefits, complications, treatment options, and expected outcomes were discussed with the patient.  The patient concurred with the proposed plan, giving informed consent. The risks of anesthesia, infection, bleeding and possible injury to other organs discussed. Injury to bowel, bladder, or ureter with possible need for repair discussed. Possible need for transfusion with secondary risks of hepatitis or HIV acquisition discussed. Post operative complications to include but not limited to DVT, PE and Pneumonia noted. The site of surgery properly noted/marked. The patient was taken to Operating Room # C, identified as Tracy Chang and the procedure verified as C-Section Delivery. A Time Out was held and the above information confirmed.  After induction of anesthesia, the patient was draped and prepped in the usual sterile manner. A Pfannenstiel incision was made and carried down through the subcutaneous tissue to the fascia. Fascial incision was made and extended transversely using Mayo scissors. The fascia was separated from the underlying rectus tissue superiorly and inferiorly. The peritoneum was identified and entered. Peritoneal incision was extended longitudinally. The utero-vesical peritoneal reflection was incised transversely and the bladder flap was bluntly freed from the lower uterine segment. A low transverse uterine incision(Kerr hysterotomy) was made. Delivered from OP presentation was a  female with Apgar scores of 8 at one minute and 9 at five minutes. Bulb suctioning gently performed. Neonatal team in  attendance.After the umbilical cord was clamped and cut cord blood was obtained for evaluation. The placenta was removed intact and appeared normal. The uterus was curetted with a dry lap pack. Good hemostasis was noted.The uterine outline, tubes and ovaries appeared normal. The uterine incision was closed with running locked sutures of 0 Monocryl x 2 layers. Hemostasis was observed. Lavage was carried out until clear.The parietal peritoneum was closed with a running 2-0 Monocryl suture. The fascia was then reapproximated with running sutures of 0 Monocryl. The skin was reapproximated with 4-0 vicryl after [place] closure with 2-0 plain suture.Prevena dressing placed.  Instrument, sponge, and needle counts were correct prior the abdominal closure and at the conclusion of the case.   Findings: FTLM, anterior placenta, nl adnexa  Estimated Blood Loss:  656         Drains: foley                 Specimens: placenta to LD                 Complications:  None; patient tolerated the procedure well.         Disposition: PACU - hemodynamically stable.         Condition: stable  Attending Attestation: I performed the procedure.

## 2020-08-06 NOTE — Progress Notes (Signed)
Tracy Chang is a 34 y.o. G2P0010 at [redacted]w[redacted]d by LMP admitted for IOL for gHTN  Subjective: Comfortable with epidural. Feeling pelvic pressure. Husband, [name], mother, and doula, [name], at the bedside providing support.   Objective: Vitals with BMI 08/06/2020 08/06/2020 08/06/2020  Height - - -  Weight - - -  BMI - - -  Systolic 130 133 416  Diastolic 62 58 68  Pulse 93 85 80   FHT:  FHR: 140 bpm, variability: moderate,  accelerations:  Present,  decelerations:  Absent UC:   2-3 minutes, MVUs 150-180 SVE:   Dilation: 5 Effacement (%): 80 Station: -2 Exam by:: Dorisann Frames CNM   Assessment / Plan: G90P0010 34 y.o. [redacted]w[redacted]d Induction of labor due to gestational hypertension, Pitocin infusing at 30mu  Labor: Arrest of dilation, arrest of descent Preeclampsia:  no signs or symptoms of toxicity and labs stable Fetal Wellbeing:  Category I Pain Control:  Epidural I/D:  GBS negative Anticipated MOD: Primary LTCS for FTP  Discussed with patient that cervical exam has not changed and that vertex is high with caput. Discussed proceeding with primary cesarean section or continuing labor. Patient and family desire to proceed with primary cesarean section. Dr. Billy Coast updated and will come to evaluate the patient.   June Leap, CNM, MSN 08/06/2020, 7:48 AM

## 2020-08-06 NOTE — Transfer of Care (Signed)
Immediate Anesthesia Transfer of Care Note  Patient: Tracy Chang  Procedure(s) Performed: CESAREAN SECTION (N/A )  Patient Location: PACU  Anesthesia Type:Epidural  Level of Consciousness: awake, alert  and oriented  Airway & Oxygen Therapy: Patient Spontanous Breathing  Post-op Assessment: Report given to RN and Post -op Vital signs reviewed and stable  Post vital signs: Reviewed and stable  Last Vitals:  Vitals Value Taken Time  BP    Temp    Pulse    Resp    SpO2      Last Pain:  Vitals:   08/06/20 0550  TempSrc: Oral  PainSc:          Complications: No complications documented.

## 2020-08-06 NOTE — Lactation Note (Signed)
This note was copied from a baby's chart. Lactation Consultation Note  Patient Name: Tracy Chang [id number] Date: 08/06/2020 Reason for consult: Follow-up assessment;Difficult latch;Primapara;1st time breastfeeding Age:34 hours  Follow up with 6 hours old infant, per mother request. RN is assisting mother to latch infant football position to right breast. Infant holds nipple in mouth but does not suckle. LC attempted football latch to right breast after diaper change. Infant had a stool.  Infant pops on and off breast. Infant unable to latch after several attempts. Initiated hand pump for nipple eversion. Mother has edema around nipple making difficult a tea-cup hold. Infant placed skin to skin with father. Collected ~30mL and spoon-fed after using hand pump. Infant fell asleep.  Encouraged mother to rest. Urged to contact [facility] for support when ready to breastfeed baby and recommended to request help for questions or concerns.   All questions answered at this time.    Maternal Data Formula Feeding for Exclusion: No Has patient been taught Hand Expression?: Yes Does the patient have breastfeeding experience prior to this delivery?: No  Feeding Feeding Type: Breast Fed  LATCH Score Latch: Repeated attempts needed to sustain latch, nipple held in mouth throughout feeding, stimulation needed to elicit sucking reflex.  Audible Swallowing: None  Type of Nipple: Everted at rest and after stimulation (short shafted)  Comfort (Breast/Nipple): Soft / non-tender  Hold (Positioning): Assistance needed to correctly position infant at breast and maintain latch.  LATCH Score: 6  Interventions Interventions: Assisted with latch;Skin to skin;Breast massage;Hand express;Pre-pump if needed;Hand pump;Expressed milk  Lactation Tools Discussed/Used Tools: Pump Breast pump type: Manual WIC Program: No Initiated by:: Tavaras Goody IBCLC Date initiated:: 08/06/20   Consult Status Consult  Status: Follow-up Date: 08/07/20 Follow-up type: In-patient    [name] A Higuera Ancidey 08/06/2020, 3:57 PM

## 2020-08-06 NOTE — Anesthesia Postprocedure Evaluation (Signed)
Anesthesia Post Note  Patient: Tracy Chang  Procedure(s) Performed: CESAREAN SECTION (N/A )     Patient location during evaluation: PACU Anesthesia Type: Epidural Level of consciousness: awake and alert Pain management: pain level controlled Vital Signs Assessment: post-procedure vital signs reviewed and stable Respiratory status: spontaneous breathing, nonlabored ventilation and respiratory function stable Cardiovascular status: stable Postop Assessment: no headache, no backache and epidural receding Anesthetic complications: no   No complications documented.  Last Vitals:  Vitals:   08/06/20 1045 08/06/20 1100  BP: 121/72 (!) 122/56  Pulse: 90 95  Resp: (!) 21 19  Temp:    SpO2: 95% 93%    Last Pain:  Vitals:   08/06/20 1100  TempSrc:   PainSc: 3    Pain Goal:    LLE Motor Response: Purposeful movement (08/06/20 1100) LLE Sensation: Tingling (08/06/20 1100) RLE Motor Response: Purposeful movement (08/06/20 1100) RLE Sensation: Tingling (08/06/20 1100)     Epidural/Spinal Function Cutaneous sensation: Tingles (08/06/20 1100), Patient able to flex knees: Yes (08/06/20 1100), Patient able to lift hips off bed: No (08/06/20 1100), Back pain beyond tenderness at insertion site: No (08/06/20 1100), Progressively worsening motor and/or sensory loss: No (08/06/20 1100), Bowel and/or bladder incontinence post epidural: No (08/06/20 1100)  Mellody Dance

## 2020-08-06 NOTE — Progress Notes (Signed)
Tracy Chang is a 34 y.o. G2P0010 at [redacted]w[redacted]d by LMP admitted for IOL for gHTN  Subjective: Comfortable with epidural and has been able to sleep. Husband, Tracy Chang, mother, and doula, Tracy Chang, at the bedside providing support. RN reports that IUPC is no longer in place.   Objective: Vitals with BMI 08/06/2020 08/05/2020 08/05/2020  Height - - -  Weight - - -  BMI - - -  Systolic 129 128 071  Diastolic 64 58 54  Pulse 93 87 80   FHT:  FHR: 140 bpm, variability: moderate,  accelerations:  Present,  decelerations:  Absent UC:   3-4 minutes SVE:   Dilation: 5 Effacement (%): 80 Station: -2 Exam by:: Tracy Chang CNM   IUPC in place and functional. Re-zeroed and working properly.   Assessment / Plan: G2P0010 34 y.o. [redacted]w[redacted]d Induction of labor due to gestational hypertension, Pitocin infusing at 38mu  Labor: Arrest of dilation, working towards active labor Preeclampsia:  no signs or symptoms of toxicity and labs stable Fetal Wellbeing:  Category I Pain Control:  Epidural I/D:  GBS negative Anticipated MOD: Guarded; working towards NSVD  Will turn Pitocin off for 1-2 hours. Will restart Pitocin at 35mu and increase by 46mu every 30 minutes until adequate MVUs.   Dr. Billy Chang updated on patient status and plan of care.   Tracy Chang, CNM, MSN 08/06/2020, 12:29 AM

## 2020-08-06 NOTE — Progress Notes (Signed)
Patient seen and examined. Consent witnessed and signed. No changes noted. Update completed.  BP 130/62   Pulse 93   Temp 98 F (36.7 C) (Oral)   Resp 17   Ht 5\' 1"  (1.549 m)   Wt (!) 147.1 kg   LMP 11/18/2019   BMI 61.29 kg/m    CBC    Component Value Date/Time   WBC 11.6 (H) 08/05/2020 1456   RBC 4.01 08/05/2020 1456   HGB 11.9 (L) 08/05/2020 1456   HCT 35.0 (L) 08/05/2020 1456   PLT 192 08/05/2020 1456   MCV 87.3 08/05/2020 1456   MCH 29.7 08/05/2020 1456   MCHC 34.0 08/05/2020 1456   RDW 13.4 08/05/2020 1456   LYMPHSABS 1.3 08/05/2020 1456   MONOABS 0.6 08/05/2020 1456   EOSABS 0.1 08/05/2020 1456   BASOSABS 0.0 08/05/2020 1456   VE: unchanged Active phase arrest Will proceed as noted.

## 2020-08-06 NOTE — Lactation Note (Signed)
This note was copied from a baby's chart. Lactation Consultation Note  Patient Name: Tracy Chang NIOEV'O Date: 08/06/2020 Reason for consult: Initial assessment Age:34 hours  Initial visit to 3 hours old infant of a P1 mother. Mother and grandmother present at time of visit. Mother states infant latch after delivery and able to be skin to skin for 60+minutes. Mother requests assistance with latch. Set up support pillows for football position to right breast. Latched infant but he does not sustain latch after a few sucks. Showed hand expression while attempting latch and colostrum easily expressed. Placed infant skin to skin with father.   Mother shows interest about pumping since she will be back to work in ~12 weeks. Mother already owns a Spectra pump.   Talked about infant's hunger and fullness cues. Reviewed newborn behavior and expectations during first days of life. Reviewed signs of good milk intake as stools and voids. Discussed skin to skin benefits.    Plan: 1-Breastfeeding on demand, ensuring a deep, comfortable latch.  2-Offer breast 8-12 times in 24h period and/or with hunger cues. 3-Undressing infant and place skin to skin when ready to breastfeed 4-Monitor voids and stools as signs good intake.  5-Encouraged maternal rest, hydration and food intake.  6-Contact LC as needed for feeds/support/concerns/questions   All questions answered at this time. Provided Lactation services brochure.   Maternal Data Formula Feeding for Exclusion: No Has patient been taught Hand Expression?: Yes Does the patient have breastfeeding experience prior to this delivery?: No  Feeding Feeding Type: Breast Fed  LATCH Score Latch: Repeated attempts needed to sustain latch, nipple held in mouth throughout feeding, stimulation needed to elicit sucking reflex.  Audible Swallowing: None  Type of Nipple: Everted at rest and after stimulation (short shafted nipples)  Comfort (Breast/Nipple):  Soft / non-tender  Hold (Positioning): Assistance needed to correctly position infant at breast and maintain latch.  LATCH Score: 6  Interventions Interventions: Breast feeding basics reviewed;Assisted with latch;Skin to skin;Breast massage;Hand express;Adjust position;Support pillows;Position options;Expressed milk  Lactation Tools Discussed/Used WIC Program: No   Consult Status Consult Status: Follow-up Date: 08/07/20 Follow-up type: In-patient    Imran Nuon A Higuera Ancidey 08/06/2020, 12:40 PM

## 2020-08-06 NOTE — Progress Notes (Signed)
Urine output slightly improved after IV fluid bolus. 100cc in 3 hours. Patient is able to walk to bathroom independently. Will keep Foley in until morning to assess urine output. Will repeat CBC and CMP now. Denies PEC symptoms.   Patient reports significant discomfort from Prevena dressing and adhesive. Seal intact and machine functional. Multiple layers of adhesive causing skin irritation. Minimal drainage from wound. Discussed replacing entire Prevena dressing or using steri strips and Honeycomb dressing. Patient elects steri strips and Honeycomb dressing. Prevena removed. Incision intact with no drainage. Benzoin and steri-strips applied. Honeycomb dressing applied. Discussed wound care and signs/symptoms of wound infection. Patient verbalizes understanding. Will assess dressing in the morning.

## 2020-08-07 DIAGNOSIS — D62 Acute posthemorrhagic anemia: Secondary | ICD-10-CM | POA: Diagnosis not present

## 2020-08-07 LAB — COMPREHENSIVE METABOLIC PANEL
ALT: 14 U/L (ref 0–44)
AST: 20 U/L (ref 15–41)
Albumin: 2.3 g/dL — ABNORMAL LOW (ref 3.5–5.0)
Alkaline Phosphatase: 90 U/L (ref 38–126)
Anion gap: 11 (ref 5–15)
BUN: 16 mg/dL (ref 6–20)
CO2: 22 mmol/L (ref 22–32)
Calcium: 8.7 mg/dL — ABNORMAL LOW (ref 8.9–10.3)
Chloride: 102 mmol/L (ref 98–111)
Creatinine, Ser: 0.88 mg/dL (ref 0.44–1.00)
GFR, Estimated: >60 mL/min (ref >60)
Glucose, Bld: 102 mg/dL — ABNORMAL HIGH (ref 70–99)
Potassium: 4.5 mmol/L (ref 3.5–5.1)
Sodium: 135 mmol/L (ref 135–145)
Total Bilirubin: 0.3 mg/dL (ref 0.3–1.2)
Total Protein: 5.3 g/dL — ABNORMAL LOW (ref 6.5–8.1)

## 2020-08-07 LAB — CBC
HCT: 25.3 % — ABNORMAL LOW (ref 36.0–46.0)
Hemoglobin: 8.6 g/dL — ABNORMAL LOW (ref 12.0–15.0)
MCH: 30 pg (ref 26.0–34.0)
MCHC: 34 g/dL (ref 30.0–36.0)
MCV: 88.2 fL (ref 80.0–100.0)
Platelets: 166 10*3/uL (ref 150–400)
RBC: 2.87 MIL/uL — ABNORMAL LOW (ref 3.87–5.11)
RDW: 13.6 % (ref 11.5–15.5)
WBC: 12.6 10*3/uL — ABNORMAL HIGH (ref 4.0–10.5)
nRBC: 0 % (ref 0.0–0.2)

## 2020-08-07 LAB — BIRTH TISSUE RECOVERY COLLECTION (PLACENTA DONATION)

## 2020-08-07 MED ORDER — IBUPROFEN 800 MG PO TABS: 800.0000 mg | ORAL_TABLET | Freq: Four times a day (QID) | ORAL | Status: DC

## 2020-08-07 MED ORDER — IBUPROFEN 800 MG PO TABS
800.0000 mg | ORAL_TABLET | Freq: Four times a day (QID) | ORAL | Status: DC
  Administered 2020-08-07 – 2020-08-09 (×9): 800 mg via ORAL
  Filled 2020-08-07 (×9): qty 1

## 2020-08-07 MED ORDER — MAGNESIUM OXIDE 400 (241.3 MG) MG PO TABS
400.0000 mg | ORAL_TABLET | Freq: Every day | ORAL | Status: DC
  Filled 2020-08-07: qty 1

## 2020-08-07 MED ORDER — POLYSACCHARIDE IRON COMPLEX 150 MG PO CAPS
150.0000 mg | ORAL_CAPSULE | Freq: Every day | ORAL | Status: DC
Start: 2020-08-07 — End: 2020-08-10
  Administered 2020-08-07 – 2020-08-09 (×3): 150 mg via ORAL
  Filled 2020-08-07 (×3): qty 1

## 2020-08-07 MED ORDER — KETOROLAC TROMETHAMINE 30 MG/ML IJ SOLN: 30.0000 mg | Freq: Four times a day (QID) | INTRAMUSCULAR | Status: DC

## 2020-08-07 MED ORDER — SERTRALINE HCL 50 MG PO TABS
50.0000 mg | ORAL_TABLET | Freq: Every day | ORAL | Status: DC
  Administered 2020-08-07 – 2020-08-08 (×2): 50 mg via ORAL
  Filled 2020-08-07 (×2): qty 1

## 2020-08-07 MED ORDER — KETOROLAC TROMETHAMINE 30 MG/ML IJ SOLN
30.0000 mg | Freq: Four times a day (QID) | INTRAMUSCULAR | Status: AC
  Administered 2020-08-07 (×2): 30 mg via INTRAMUSCULAR
  Filled 2020-08-07 (×2): qty 1

## 2020-08-07 MED ORDER — SODIUM CHLORIDE 0.9 % IV SOLN
500.0000 mg | Freq: Once | INTRAVENOUS | Status: DC
  Filled 2020-08-07: qty 25

## 2020-08-07 NOTE — Progress Notes (Signed)
CSW has made three attempts to speak with MOB to further assess for anxiety and depression. CSW will try back later to see MOB.    Claude Manges Abbigal Radich, MSW, LCSW [facility] [place] [phone]

## 2020-08-07 NOTE — Progress Notes (Signed)
CSW went to speak with MOB once more at bedside however CSW advised that MOB is still asleep. CSW to try and speak with MOB on 08/08/20 to address further needs.  ° ° ° °Geralda Baumgardner S. Colten Desroches, MSW, LCSW °[facility] [place] °[phone]  ° °

## 2020-08-07 NOTE — Progress Notes (Signed)
Subjective: POD# 1 Live born female  Birth Weight: 8 lb 4.5 oz (3755 g) APGAR: 8, 9  Newborn Delivery   Birth date/time: 08/06/2020 09:18:00 Delivery type: C-Section, Low Transverse Trial of labor: Yes C-section categorization: Primary     Baby name: [name] Delivering provider: Olivia Mackie   Circumcision Yes, planning Feeding: breast  Pain control at delivery: Epidural   Reports feeling exhausted. Baby is on bili lights and sleep was rare last night. Reports increased pain today with moving. Bleeding stable. Up to bathroom to void. Family at the bedside.   Patient reports tolerating PO.   Pain controlled with acetaminophen, ibuprofen (OTC) and narcotic analgesics including Oxy IR Denies HA/SOB/C/P/N/V/dizziness. Flatus yes. She reports vaginal bleeding as normal, without clots. She is ambulating and urinating without difficulty.     Objective: Vitals:   08/06/20 2023 08/06/20 2300 08/07/20 0540 08/07/20 1413  BP: (!) 115/59 (!) 113/59 (!) 98/55 120/67  Pulse: 81 83 67 93  Resp: 20 20  20   Temp: 98.1 F (36.7 C) 98.2 F (36.8 C) 98.1 F (36.7 C)   TempSrc: Oral Oral Oral   SpO2: 95% 95%    Weight:      Height:          Intake/Output Summary (Last 24 hours) at 08/07/2020 1424 Last data filed at 08/07/2020 0815 Gross per 24 hour  Intake 1650 ml  Output 835 ml  Net 815 ml        Recent Labs    08/06/20 2314 08/07/20 0734  WBC 18.0* 12.6*  HGB 9.8* 8.6*  HCT 28.7* 25.3*  PLT 186 166     Blood type: --/--/A POS (12/26 0025)  Rubella:     Vaccines:   TDaP          UTD           Flu             UTD                      COVID-19 UTD  Physical Exam:  General: alert and cooperative CV: Regular rate and rhythm Resp: clear Abdomen: soft, nontender, normal bowel sounds Incision: clean, dry and intact Uterine Fundus: firm, below umbilicus, nontender Lochia: minimal and moderate Ext: extremities normal, atraumatic, no cyanosis or edema and edema 2+  non-pitting edema to lower extremities  Assessment/Plan: 34 y.o.   POD# 1. 20                  Principal Problem:   Postpartum care following cesarean delivery 12/27 Active Problems:   Gestational hypertension   Anxiety   Asthma   Failure to progress in labor   Status post primary low transverse cesarean section 12/27   Morbid obesity with body mass index (BMI) of 60.0 to 69.9 in adult (HCC)   Anemia associated with acute blood loss EBL 724            Encourage rest when baby rests Breastfeeding support Encourage to ambulate Routine post-op care Discontinue Labetalol due to hypotension Denies PEC symptoms PEC labs stable Start Zoloft 50mg  for anxiety Start Lovenox for DVT prophylaxis due to BMI Ordered IV iron. Patient declines placement of PIV and desires PO iron only.   Anticipate discharge on PO day #3.  1/28, CNM, MSN 08/07/2020, 2:24 PM

## 2020-08-08 NOTE — Lactation Note (Signed)
This note was copied from a baby's chart. Lactation Consultation Note  Patient Name: Tracy Chang HYQMV'H Date: 08/08/2020 Reason for consult: Follow-up assessment;Early term 37-38.6wks;Difficult latch;Hyperbilirubinemia Age:34 hours Baby with 5.19% wt loss. Called to room by RN, states mom requests help with latch. Upon entering room baby in bassinet laying on biliblanket, dad and mom standing at bassinet. Mom reports baby latched to breast earlier today and nursed for , but has had difficulty latching since. Mom reports baby finally breastfed for followed by [facility] after attempting to latch for 2hrs. Mom requests LC support for next feeding, requests to rest at this time.   For next feeding call LC, encouraged skin to skin and offer a few mls of EBM then latch to breast. Reinforced cue based feeding, wake if >3hrs since last feeding, signs of adequate milk transfer and skin to skin. Mom voiced understanding and with no further concerns. BGilliam, RN, IBCLC   Interventions Interventions: Breast feeding basics reviewed    Consult Status Consult Status: Follow-up Date: 08/08/20 Follow-up type: In-patient    Charlynn Court 08/08/2020, 9:38 PM

## 2020-08-08 NOTE — Lactation Note (Signed)
This note was copied from a baby's chart. Lactation Consultation Note Baby 75 hrs old. Mom stated the baby is doing better but she feels like the baby needs to get deeper. The baby has a recessed chin. Encouraged mom to do chin tug when latching to obtain deeper latch.  Baby is on DPT. Mom is supplementing w/colostrum and donor milk. Encouraged mom to increase amount of supplement to 15-20 ml. To increase stool output to assist in lowering bili levels.  Praised mom for her hard work. Mom has been using DEBP today for extra supplement.  Encouraged mom to cont. What she is doing.  Call for [facility] assistance tonight or tomorrow.  Patient Name: Tracy Chang MIWOE'H Date: 08/08/2020 Reason for consult: Follow-up assessment;Hyperbilirubinemia;Early term 37-38.6wks;Primapara Age:48 hours  Maternal Data    Feeding Feeding Type: Breast Milk with Donor Milk Nipple Type: Slow - flow  LATCH Score       Type of Nipple: Everted at rest and after stimulation (short shaft)           Interventions Interventions: Breast feeding basics reviewed;Shells;DEBP;Position options;Hand express;Breast massage  Lactation Tools Discussed/Used Initiated by:: RN Date initiated:: 08/07/20   Consult Status Consult Status: Follow-up Date: 08/08/20 Follow-up type: In-patient    Tracy Chang, Tracy Chang 08/08/2020, 12:24 AM

## 2020-08-08 NOTE — Progress Notes (Signed)
CSW received consult for hx of Anxiety and Depression.  CSW met with MOB to offer support and complete assessment.    CSW congratulated MOB on the birth of infant. CSW noted that FOB was on the couch asleep. CSW advised MOB of HIPPA policy in which MOB expressed that it was okay for CSW to speak in front of Fob as he was asleep. CSW understanding and then advised MOB of CSW's role and the reason for CSW coming to speak with her. MOB expressed that she does have a hx of anxiety and depression. MOB reported that prior to pregnancy she was taking Zoloft however MOB reported that she stopped taking the medication out of fear of "harming him". CSW validated these feelings and asked MOB about current medication use in which MOB reported that she restarted her Zoloft last night. MOB expressed that prior to stopping the medication Zoloft was working well for her. MOB also reported that she has a prescription for Xanax for her anxiety in which MOB reported she takes PRN. MOB expressed to CSW that she had a hard time during pregnancy with anxiety due to stopping medication. MOB reported that she did not seek therapy during that time as her work schedule was really busy. CSW offered MOB therapy resources in which MOB reported the ability to follow up with therapist as needed. MOB reported that she has no other mental health hx and denies SI, HI and DV when CSW wrote question on paper.   CSW inquired from [facility] on her supports in which MOB expressed that she has support from her spouse, mom and his mom. MOB indicated that she has all needed items to care for infant with plans for infant to sleep in basinet once arrived home.   CSW provided education regarding the baby blues period vs. perinatal mood disorders, discussed treatment and gave resources for mental health follow up if concerns arise.  CSW recommends self-evaluation during the postpartum time period using the New Mom Checklist from Postpartum Progress and  encouraged MOB to contact a medical professional if symptoms are noted at any time.  MOB thanked CSW for providing education and expressed no other questions or needs to CSW at this time.  CSW provided review of Sudden Infant Death Syndrome (SIDS) precautions.   CSW identifies no further need for intervention and no barriers to discharge at this time.   Virgie Dad Asani Deniston, MSW, LCSW [facility] [place] [phone]

## 2020-08-08 NOTE — Progress Notes (Signed)
POSTOPERATIVE DAY # 2 S/P Primary LTCS for arrest of dilation, baby boy "[name]"  S:         Reports feeling okay, reports some incisional pain with moving, but manageable with current regimen of Motrin and Percocet  Denies HA, visual changes, RUQ/epigastric pain              Tolerating po intake / no nausea / no vomiting / + flatus / + BM  Denies dizziness, SOB, or CP             Bleeding is getting lighter             Up ad lib / ambulatory/ voiding QS without difficulty   Newborn breast feeding and supplementing with donor milk. Very anxious over discharge tomorrow and making sure baby is getting enough milk.  Wants to avoid formula.   Circumcision - planning tomorrow prior to d/c   O:  VS: BP 113/60 (BP Location: Right Arm)   Pulse 88   Temp 98.1 F (36.7 C) (Oral)   Resp 18   Ht 5\' 1"  (1.549 m)   Wt (!) 147.1 kg   LMP 11/18/2019   SpO2 98%   Breastfeeding Unknown   BMI 61.29 kg/m   Patient Vitals for the past 24 hrs:  BP Temp Temp src Pulse Resp SpO2  08/08/20 1509 132/72 98.5 F (36.9 C) Oral 91 19 --  08/08/20 0645 113/60 98.1 F (36.7 C) Oral 88 18 98 %  08/07/20 2003 139/68 98.2 F (36.8 C) Oral 87 18 98 %     LABS:              Recent Labs    08/06/20 2314 08/07/20 0734  WBC 18.0* 12.6*  HGB 9.8* 8.6*  PLT 186 166               Bloodtype: --/--/A POS (12/26 0025)  Rubella:                      Flu: UTD Tdap: UTD Covid: UTD                         I&O: Intake/Output      12/28 0701 12/29 0700 12/29 0701 12/30 0700   P.O.     I.V. (mL/kg) 0 (0)    Other 0    IV Piggyback 0    Total Intake(mL/kg) 0 (0)    Urine (mL/kg/hr) 300 (0.1)    Blood     Total Output 300    Net -300                      Physical Exam:             Alert and Oriented X3  Lungs: Clear and unlabored  Heart: regular rate and rhythm / no murmurs  Abdomen: soft, non-tender, obese, active bowel sounds, edematous pannus              Fundus: firm, non-tender, below umbilicus              Dressing: honeycomb with steri-strips c/d/i              Incision:  approximated with sutures / no erythema / no ecchymosis / no drainage  Perineum: intact  Lochia: small rubra on pad   Extremities: +2 LE edema, no calf pain or tenderness,   A/P:  POD # 2 S/P Primary LTCS            Gestational Hypertension with dependent edema    - BPs stable; off Labetalol since delivery   - Labs stable, no s/s of PEC   - Monitor BPs closely with dependent edema, discussed possibility of starting HCTZ tomorrow depending on BP  ABL Anemia compounding chronic IDA   - yesterday declined IV site replacement for IV infusion, remains asymptomatic   - on oral FE daily and plan to continue 6 weeks PP  Anxiety   - restarted Zoloft 50 mg yesterday; did not tolerate PRN Buspar   - Discussed Xanax not recommended with breastfeeding, but advised we could try PRN Vistaril if necessary and patient declines at this time   - High risk for PPD/anxiety, close f/u PP   - s/p CSW consult  Morbid Obesity BMI 61   - on Lovenox for DVT prophylaxis and ambulation encouraged   Lactation support PRN   Routine postoperative care              Plan for discharge home tomorrow with BP and mood check in 1 week  Carlean Jews, MSN, CNM [facility]

## 2020-08-08 NOTE — Lactation Note (Signed)
This note was copied from a baby's chart. Lactation Consultation Note Baby 42 hrs old at time of consult. Mom feels latch isn't deep enough at times and baby gets frustrated, pulls off of breast and cries.  Mom has areola edema. Let mom feel breast tissue around nipple. Encouraged to do finger stimulation before latching or by pre-pumping. Discussed importance of softening tissue for baby to get a deeper latch.  Baby BF well appear satisfied.  On DPT. jaundice in color. Mom stated since baby BF well do I need to still supplement. LC stated yes d/t baby is DPT you need to supplement 15-20 ml. D/t breast tissue/nipples/areola being somewhat thickened w/edema, transfer is questionable if would be enough w/o softening breast more. Shells given encouraged to wear today w/bra. Encouraged to pump for stimulation and supplementation.  When LC leaving, baby passing a lot of gas. Mom thanked [facility] for assistance. Mom would like LC to come and see her again today.  Patient Name: Tracy Chang [id number] Date: 08/08/2020 Reason for consult: Mother's request;Primapara;Early term 37-38.6wks;Hyperbilirubinemia Age:67 hours  Maternal Data    Feeding Feeding Type: Donor Breast Milk Nipple Type: Nfant Slow Flow (purple)  LATCH Score Latch: Grasps breast easily, tongue down, lips flanged, rhythmical sucking.  Audible Swallowing: None  Type of Nipple: Everted at rest and after stimulation  Comfort (Breast/Nipple): Filling, red/small blisters or bruises, mild/mod discomfort (areola edema)  Hold (Positioning): Assistance needed to correctly position infant at breast and maintain latch.  LATCH Score: 6  Interventions Interventions: Breast feeding basics reviewed;Reverse pressure;Assisted with latch;Breast compression;Shells;Skin to skin;Adjust position;Breast massage;Support pillows;Hand pump;Hand express;Position options;DEBP;Pre-pump if needed;Expressed milk  Lactation Tools  Discussed/Used Tools: Shells;Pump;Flanges Flange Size: 24;27 Shell Type: Inverted Breast pump type: Double-Electric Breast Pump   Consult Status Consult Status: Follow-up Date: 08/08/20 Follow-up type: In-patient    Tracy Chang, Tracy Chang 08/08/2020, 4:47 AM

## 2020-08-09 ENCOUNTER — Ambulatory Visit: Payer: Self-pay

## 2020-08-09 MED ORDER — MAGNESIUM OXIDE 400 (241.3 MG) MG PO TABS: 400.0000 mg | ORAL_TABLET | Freq: Every day | ORAL | 1 refills | Status: DC

## 2020-08-09 MED ORDER — COCONUT OIL OIL: 1.0000 "application " | TOPICAL_OIL | 0 refills | Status: DC | PRN

## 2020-08-09 MED ORDER — OXYCODONE-ACETAMINOPHEN 5-325 MG PO TABS: 1.0000 | ORAL_TABLET | ORAL | 0 refills | Status: DC | PRN

## 2020-08-09 MED ORDER — POLYSACCHARIDE IRON COMPLEX 150 MG PO CAPS: 150.0000 mg | ORAL_CAPSULE | Freq: Every day | ORAL | 1 refills | Status: DC

## 2020-08-09 MED ORDER — HYDROCHLOROTHIAZIDE 25 MG PO TABS: 25.0000 mg | ORAL_TABLET | Freq: Every day | ORAL | Status: DC

## 2020-08-09 MED ORDER — HYDROCHLOROTHIAZIDE 25 MG PO TABS: 25.0000 mg | ORAL_TABLET | Freq: Every day | ORAL | 0 refills | Status: DC

## 2020-08-09 MED ORDER — ENOXAPARIN SODIUM 80 MG/0.8ML ~~LOC~~ SOLN: 0.5000 mg/kg | SUBCUTANEOUS | Status: DC

## 2020-08-09 MED ORDER — IBUPROFEN 800 MG PO TABS: 800.0000 mg | ORAL_TABLET | Freq: Four times a day (QID) | ORAL | 0 refills | Status: DC

## 2020-08-09 NOTE — Discharge Summary (Signed)
OB Discharge Summary  Patient Name: Tracy Chang DOB: 1986/07/28 MRN: [id number]  Date of admission: 08/05/2020 Delivering provider: Olivia Mackie   Admitting diagnosis: Gestational hypertension [O13.9] Intrauterine pregnancy: [redacted]w[redacted]d     Secondary diagnosis: Patient Active Problem List   Diagnosis Date Noted  . Anemia associated with acute blood loss EBL 724 08/07/2020  . Anxiety 08/06/2020  . Asthma 08/06/2020  . Failure to progress in labor 08/06/2020  . Status post primary low transverse cesarean section 12/27 08/06/2020  . Postpartum care following cesarean delivery 12/27 08/06/2020  . Morbid obesity with body mass index (BMI) of 60.0 to 69.9 in adult [facility]) 08/06/2020  . Gestational hypertension 08/05/2020    Date of discharge: 08/09/2020   Discharge diagnosis: Principal Problem:   Postpartum care following cesarean delivery 12/27 Active Problems:   Gestational hypertension   Anxiety   Asthma   Failure to progress in labor   Status post primary low transverse cesarean section 12/27   Morbid obesity with body mass index (BMI) of 60.0 to 69.9 in adult (HCC)   Anemia associated with acute blood loss EBL 724                                                            Post partum procedures:None  Augmentation: AROM, Pitocin, Cytotec and IP Foley Pain control: Epidural  Laceration:None  Episiotomy:None  Complications: None  Hospital course:  Induction of Labor With Cesarean Section   34 y.o. yo G2P1011 at [redacted]w[redacted]d was admitted to the hospital 08/05/2020 for induction of labor for gestational hypertension. Patient had a labor course significant for failure of descent. The patient went for cesarean section due to Arrest of Descent. Delivery details are as follows: Membrane Rupture Time/Date: 1:32 PM ,08/05/2020   Delivery Method:C-Section, Low Transverse  Details of operation can be found in separate operative Note.  Patient had a postpartum course complicated by anemia. She was  started on PO Niferex and mag oxide. She also has a history of anxiety and was restarted on Zoloft 50mg . She is ambulating, tolerating a regular diet, passing flatus, and urinating well.  Patient is discharged to boarding status in stable condition on 08/09/20.      Newborn Data: Birth date:08/06/2020  Birth time:9:18 AM  Gender:Female  Living status:Living  Apgars:8 ,9  Weight:3755 g                                Physical exam  Vitals:   08/08/20 2152 08/08/20 2204 08/09/20 0558 08/09/20 1448  BP: (!) 157/78 122/76 121/68 133/75  Pulse: 96 92 90 89  Resp: 20  18 18   Temp: 98 F (36.7 C)  98.3 F (36.8 C) 98 F (36.7 C)  TempSrc: Oral  Oral Oral  SpO2:   96%   Weight:      Height:       General: alert, cooperative and no distress Lochia: appropriate Uterine Fundus: firm Incision: Healing well with no significant drainage, Dressing is clean, dry, and intact DVT Evaluation: No evidence of DVT seen on physical exam. No significant calf/ankle edema. Labs: Lab Results  Component Value Date   WBC 12.6 (H) 08/07/2020   HGB 8.6 (L) 08/07/2020   HCT 25.3 (L) 08/07/2020  MCV 88.2 08/07/2020   PLT 166 08/07/2020   CMP Latest Ref Rng & Units 08/06/2020  Glucose 70 - 99 mg/dL 423(N)  BUN 6 - 20 mg/dL 16  Creatinine 3.61 - 4.43 mg/dL 1.54  Sodium 008 - 676 mmol/L 135  Potassium 3.5 - 5.1 mmol/L 4.5  Chloride 98 - 111 mmol/L 102  CO2 22 - 32 mmol/L 22  Calcium 8.9 - 10.3 mg/dL 1.9(J)  Total Protein 6.5 - 8.1 g/dL 5.3(L)  Total Bilirubin 0.3 - 1.2 mg/dL 0.3  Alkaline Phos 38 - 126 U/L 90  AST 15 - 41 U/L 20  ALT 0 - 44 U/L 14   Edinburgh Postnatal Depression Scale Screening Tool 08/06/2020  I have been able to laugh and see the funny side of things. 0  I have looked forward with enjoyment to things. 0  I have blamed myself unnecessarily when things went wrong. 1  I have been anxious or worried for no good reason. 1  I have felt scared or panicky for no good reason. 2   Things have been getting on top of me. 0  I have been so unhappy that I have had difficulty sleeping. 0  I have felt sad or miserable. 0  I have been so unhappy that I have been crying. 0  The thought of harming myself has occurred to me. 0  Edinburgh Postnatal Depression Scale Total 4   Vaccines: TDaP UTD         Flu    UTD         COVID-19   UTD  Discharge instructions:  per After Visit Summary and [facility] OB booklet  After Visit Meds:  Allergies as of 08/09/2020      Reactions   Augmentin [amoxicillin-pot Clavulanate] Hives   States that she can take amoxicillin   Ceclor [cefaclor] Hives   Demerol [meperidine] Hives   Latex    Versed [midazolam]    Panic attack while on versed   Compazine [prochlorperazine] Anxiety      Medication List    STOP taking these medications   acetaminophen 500 MG tablet Commonly known as: TYLENOL   labetalol 100 MG tablet Commonly known as: NORMODYNE   sertraline 50 MG tablet Commonly known as: ZOLOFT     TAKE these medications   albuterol 108 (90 Base) MCG/ACT inhaler Commonly known as: VENTOLIN HFA Inhale 1-2 puffs into the lungs every 6 (six) hours as needed for wheezing or shortness of breath.   cetirizine 10 MG tablet Commonly known as: ZYRTEC Take 10 mg by mouth daily.   coconut oil Oil Apply 1 application topically as needed.   hydrochlorothiazide 25 MG tablet Commonly known as: HYDRODIURIL Take 1 tablet (25 mg total) by mouth daily for 5 days.   ibuprofen 800 MG tablet Commonly known as: ADVIL Take 1 tablet (800 mg total) by mouth every 6 (six) hours.   iron polysaccharides 150 MG capsule Commonly known as: Ferrex 150 Take 1 capsule (150 mg total) by mouth daily.   magnesium oxide 400 (241.3 Mg) MG tablet Commonly known as: MAG-OX Take 1 tablet (400 mg total) by mouth daily. Start taking on: August 10, 2020   oxyCODONE-acetaminophen 5-325 MG tablet Commonly known as: PERCOCET/ROXICET Take 1-2 tablets by  mouth every 4 (four) hours as needed for moderate pain.   prenatal multivitamin Tabs tablet Take 1 tablet by mouth daily at 12 noon.      Diet: routine diet  Activity: Advance as tolerated. Pelvic  rest for 6 weeks.   Newborn Data: Live born female  Birth Weight: 8 lb 4.5 oz (3755 g) APGAR: 8, 9  Newborn Delivery   Birth date/time: 08/06/2020 09:18:00 Delivery type: C-Section, Low Transverse Trial of labor: Yes C-section categorization: Primary     Named [name] Baby Feeding: Breast Disposition:rooming in  Delivery Report:  Review the Delivery Report for details.    Follow up:  Follow-up Information    June Leap, CNM. Schedule an appointment as soon as possible for a visit in 1 week(s).   Specialty: Certified Nurse Midwife Why: Please make an appointment for 1 week postpartum for blood pressure check. Contact information: [address] [place] [address] [phone]              June Leap, CNM, MSN 08/09/2020, 6:20 PM

## 2020-08-09 NOTE — Progress Notes (Signed)
Dorisann Frames CNM stated she will be here after office hours to dc patient

## 2020-08-09 NOTE — Discharge Instructions (Signed)

## 2020-08-09 NOTE — Lactation Note (Signed)
This note was copied from a baby's chart. Lactation Consultation Note Baby was 48 hrs old at time of consult. Mom had been frustrated d/t baby frustrated. Mom had been crying. Mom is worried she will not be able to do what the baby needs in BF.  Mom was semi fowlers position in laid back position BF baby.  Baby was BF great. Good breast compressions w/suckling. Heard swallows. Mom not crying when LC entered room. RN had calmed mom down and assisted in latching.  Baby feeding well. Encouraged mom and praised mom for a good feed. FOB at bedside and supportive, Mom admits she is anxious about going home and not knowing what to do or has trouble latching. Tell mom that is totally normal. Spoke w/RN about consult.  Patient Name: Tracy Chang [id number] Date: 08/09/2020 Reason for consult: Difficult latch;Primapara;Early term 37-38.6wks;Hyperbilirubinemia Age:34 hours  Maternal Data    Feeding Feeding Type: Breast Fed  LATCH Score Latch: Grasps breast easily, tongue down, lips flanged, rhythmical sucking.  Audible Swallowing: A few with stimulation  Type of Nipple: Everted at rest and after stimulation (short shaft)  Comfort (Breast/Nipple): Soft / non-tender  Hold (Positioning): Assistance needed to correctly position infant at breast and maintain latch.  LATCH Score: 8  Interventions Interventions: Breast compression;Adjust position;Breast massage;Support pillows;Position options;Pre-pump if needed  Lactation Tools Discussed/Used Tools: Shells;Pump Shell Type: Inverted Breast pump type: Double-Electric Breast Pump   Consult Status Consult Status: Follow-up Date: 08/09/20 Follow-up type: In-patient    Nymir Ringler, ANESHA HACKERT 08/09/2020, 2:11 AM

## 2020-08-09 NOTE — Lactation Note (Signed)
This note was copied from a baby's chart. Lactation Consultation Note  Patient Name: Tracy Chang [id number] Date: 08/09/2020   Age:34 days  RN/mother request. LC took over feeding assist from RN.  LC assist at breast.  Infant latched well for a few suckles and swallows and teeters out.  Attempt to use SNS at nipple with nipple shield as well.  Infant will not latch.  Urged mom to go ahead and feed him the rest of the breastmilk she pumped. Mom pumped 25 ml at last session.  Infant has trouble with slow flow nipple.  Attempt to get her to pace him. Urged her to feed  Him at the breast and limit to no more than 30 minutes trying and then follow up with whatever breastmilk she pumped and or donor milk to make the recommended amount and more if he wanted more since he is early term and struggling some with feeds and jaundiced.  Mom voices understanding.  Discussed with MD regarding SLP referral due to poor feeding.  Urged mom to call lactation as needed.    Feeding Feeding Type: Breast Milk  LATCH Score                   Interventions    Lactation Tools Discussed/Used     Consult Status      Tracy Chang Tracy Chang 08/09/2020, 8:10 PM

## 2020-08-09 NOTE — Lactation Note (Signed)
This note was copied from a baby's chart. Lactation Consultation Note  Patient Name: Tracy Chang Date: 08/09/2020 Reason for consult: Primapara;Follow-up assessment;Early term 37-38.6wks;Hyperbilirubinemia;Other (Comment) (obesity) Age:34 hours Mom/rn requested lactation.  Infant finishing bottle on arrival.  Gulping and smacking and sounds like taking in air. Using extra slow flow purple nipple from Similac.  Mom reports she has been trying to feed him since 12 pm.  Mom reports he had started breastfeeding really well and that now since his circumcision he isnt feeding well again.   Mom reports she finally just bottle fed him breastmilk and formula. Urged mom to leave him STS and watch for early feeding cues. Urged her to pump every 3 hours if he isn't taking the breast for stimulation and to feed back all expressed mothers milk first prior to feeding formula.   Discussed with RN referral to speech. Urged mom to call lactation at next feed.  Feed on cue and at least 8-12 or more times day Maternal Data    Feeding Feeding Type: Breast Fed Nipple Type: Extra Slow Flow  LATCH Score Latch: Too sleepy or reluctant, no latch achieved, no sucking elicited.  Audible Swallowing: None  Type of Nipple: Everted at rest and after stimulation  Comfort (Breast/Nipple): Filling, red/small blisters or bruises, mild/mod discomfort  Hold (Positioning): Assistance needed to correctly position infant at breast and maintain latch.  LATCH Score: 4  Interventions Interventions: Breast feeding basics reviewed;Assisted with latch;Skin to skin;Breast massage;Hand express  Lactation Tools Discussed/Used Tools: Nipple Dorris Carnes (mom has haaka nipple shield by bedside) Breast pump type: Double-Electric Breast Pump   Consult Status Consult Status: Follow-up Date: 08/09/20 Follow-up type: In-patient    [facility] [name] 08/09/2020, 2:44 PM

## 2020-08-10 ENCOUNTER — Ambulatory Visit: Payer: Self-pay

## 2020-08-10 NOTE — Lactation Note (Signed)
This note was copied from a baby's chart. Lactation Consultation Note  Patient Name: Tracy Chang [id number] Date: 08/10/2020   Age:34 days  Mom is a [employer] and she has DEBP. P1, ETI female infant -5% weight loss . Infant has been poor feeder, immaturity, not sustaining latch , ETI infant with hx of  juandice. Per mom, infant was taken off phototherapy   today and will be re-assessed at 2300 pm. LC did not observe latch infant asleep in basinet Mom had a  Speech Pathology Consult and mom has a current feeding plan. Per mom, infant is taking 40 mls currently with each feeding using the Doctor Loseke's NFAnt with pace feeding. Mom will continue to work on latching infant at the breast.  Per mom, she is currently pumping 60 mls of EBM with every pumping session. Per mom, she will be doing milk sharing  with her friend who has any oversupply, mom has been given a weeks worth of breast milk that is label and date in her fridge at home. Mom will continue to work toward latching infant at the breast and LC discussed after hospital discharge to attend [facility] which is free within the local community. Mom is aware of LC outpatient [facility] clinic, if mom needs further assistance with latch. Mom's feeding plan:  1- Mom will  continue  to latch infant at every feeding 1st , if infant doesn't latch, mom will continue to offer infant 40 to 45 mls of her EBM using the  Doctor's Mastandrea slow flow nipple pac feeding infant. 2. Mom will continue to use DEBP with hands on expression to help establish and sustain her milk supply. 3- Mom plans to do milk share with her friend if her  milk supply becomes low to offer infant donor breast milk. 4- Mom will follow up with breast feeding support group, LC hotline or LC outpatient clinic if needed to help her with breastfeeding infant.  Maternal Data    Feeding Feeding Type: Donor Breast Milk  LATCH Score                    Interventions    Lactation Tools Discussed/Used     Consult Status      Tracy Chang 08/10/2020, 7:12 PM

## 2020-08-10 NOTE — Lactation Note (Signed)
This note was copied from a baby's chart. Lactation Consultation Note  Patient Name: Tracy Chang [id number] Date: 08/10/2020   Age:34 days  Maternal Data  LC attempted to see.  Mom sleeping.  Will follow up later this pm  Feeding Feeding Type: Breast Milk  LATCH Score                   Interventions    Lactation Tools Discussed/Used     Consult Status      Penda Venturi [name] 08/10/2020, 2:29 PM

## 2020-08-11 ENCOUNTER — Ambulatory Visit: Payer: Self-pay

## 2020-08-11 NOTE — Lactation Note (Signed)
This note was copied from a baby's chart. Lactation Consultation Note  Patient Name: Tracy Chang [id number] Date: 08/11/2020 Reason for consult: Follow-up assessment Age:35 days   P1 mother whose infant is now 22 days old.  This is an ETI at 37+3 weeks.  Baby has had feeding difficulties and has also received phototherapy.  The last bilirubin level was 15.3 mg/dl at 195 hours of life.  Parents are awaiting another bilirubin level drawn approximatley 30 minutes ago.  SLP consult completed.  Mother has been attempting to breast feed, however, has spent the majority of the last 24 hours bottle feeding only per plan developed by the SLP.  Baby's volumes have been appropriate (40-52 mls per feeding).  Parents have been feeding every three hours.  Discussed the importance of limiting latching attempts to 10 minutes to conserve caloric expenditure and to have adequate time to bottle feed baby.  Parents verbalized understanding.  Parents are hoping to be discharged today and feel confident that they will be able to follow the established feeding plan.  Baby has been voiding/stooling well.    Engorgement prevention/treatment reviewed.  Mother has a manual pump and a DEBP for home use.  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  Father present and very supportive.    Mother has a Advertising copywriter that will be making home visits after discharge.  The family has medical personnel they can consult for assistance and a family physician they are pleased with who cares for the remainder of the other family members.  Parents have our OP phone number for any general questions.  RN in room at the end of my visit.   Maternal Data    Feeding Feeding Type: Breast Milk  LATCH Score                   Interventions    Lactation Tools Discussed/Used     Consult Status Consult Status: Complete Date: 08/11/20 Follow-up type: Call as needed    Damel Querry R Valree Feild 08/11/2020,  11:14 AM

## 2020-08-13 DIAGNOSIS — R600 Localized edema: Secondary | ICD-10-CM | POA: Diagnosis not present

## 2020-08-13 DIAGNOSIS — G8918 Other acute postprocedural pain: Secondary | ICD-10-CM | POA: Diagnosis not present

## 2020-08-13 DIAGNOSIS — O139 Gestational [pregnancy-induced] hypertension without significant proteinuria, unspecified trimester: Secondary | ICD-10-CM | POA: Diagnosis not present

## 2020-08-14 ENCOUNTER — Other Ambulatory Visit (HOSPITAL_COMMUNITY): Payer: Self-pay | Admitting: Certified Nurse Midwife

## 2020-08-22 DIAGNOSIS — O135 Gestational [pregnancy-induced] hypertension without significant proteinuria, complicating the puerperium: Secondary | ICD-10-CM | POA: Diagnosis not present

## 2020-08-22 DIAGNOSIS — T888XXA Other specified complications of surgical and medical care, not elsewhere classified, initial encounter: Secondary | ICD-10-CM | POA: Diagnosis not present

## 2020-09-03 ENCOUNTER — Other Ambulatory Visit (HOSPITAL_COMMUNITY): Payer: Self-pay

## 2020-09-27 DIAGNOSIS — Z3482 Encounter for supervision of other normal pregnancy, second trimester: Secondary | ICD-10-CM | POA: Diagnosis not present

## 2020-09-27 DIAGNOSIS — Z30430 Encounter for insertion of intrauterine contraceptive device: Secondary | ICD-10-CM | POA: Diagnosis not present

## 2020-09-27 DIAGNOSIS — Z3483 Encounter for supervision of other normal pregnancy, third trimester: Secondary | ICD-10-CM | POA: Diagnosis not present

## 2020-10-24 DIAGNOSIS — Z30431 Encounter for routine checking of intrauterine contraceptive device: Secondary | ICD-10-CM | POA: Diagnosis not present

## 2020-11-13 DIAGNOSIS — Z01419 Encounter for gynecological examination (general) (routine) without abnormal findings: Secondary | ICD-10-CM | POA: Diagnosis not present

## 2020-11-13 DIAGNOSIS — Z6841 Body Mass Index (BMI) 40.0 and over, adult: Secondary | ICD-10-CM | POA: Diagnosis not present

## 2020-11-13 DIAGNOSIS — N898 Other specified noninflammatory disorders of vagina: Secondary | ICD-10-CM | POA: Diagnosis not present

## 2020-11-13 DIAGNOSIS — Z30431 Encounter for routine checking of intrauterine contraceptive device: Secondary | ICD-10-CM | POA: Diagnosis not present

## 2020-11-13 DIAGNOSIS — Z01411 Encounter for gynecological examination (general) (routine) with abnormal findings: Secondary | ICD-10-CM | POA: Diagnosis not present

## 2020-11-13 DIAGNOSIS — R87810 Cervical high risk human papillomavirus (HPV) DNA test positive: Secondary | ICD-10-CM | POA: Diagnosis not present

## 2020-11-13 DIAGNOSIS — Z113 Encounter for screening for infections with a predominantly sexual mode of transmission: Secondary | ICD-10-CM | POA: Diagnosis not present

## 2020-11-13 DIAGNOSIS — Z124 Encounter for screening for malignant neoplasm of cervix: Secondary | ICD-10-CM | POA: Diagnosis not present

## 2020-12-05 DIAGNOSIS — J0110 Acute frontal sinusitis, unspecified: Secondary | ICD-10-CM | POA: Diagnosis not present

## 2020-12-05 DIAGNOSIS — J4520 Mild intermittent asthma, uncomplicated: Secondary | ICD-10-CM | POA: Diagnosis not present

## 2020-12-11 DIAGNOSIS — Z30430 Encounter for insertion of intrauterine contraceptive device: Secondary | ICD-10-CM | POA: Diagnosis not present

## 2020-12-11 DIAGNOSIS — T8332XA Displacement of intrauterine contraceptive device, initial encounter: Secondary | ICD-10-CM | POA: Diagnosis not present

## 2021-01-17 ENCOUNTER — Other Ambulatory Visit (HOSPITAL_COMMUNITY): Payer: Self-pay

## 2021-01-17 MED ORDER — SERTRALINE HCL 100 MG PO TABS
100.0000 mg | ORAL_TABLET | Freq: Every day | ORAL | 0 refills | Status: DC
  Filled 2021-01-17: qty 60, 60d supply, fill #0

## 2021-01-18 ENCOUNTER — Other Ambulatory Visit (HOSPITAL_COMMUNITY): Payer: Self-pay

## 2021-01-18 MED ORDER — SERTRALINE HCL 150 MG PO CAPS
ORAL_CAPSULE | ORAL | 3 refills | Status: DC
  Filled 2021-01-18: qty 30, 30d supply, fill #0
  Filled 2021-02-15: qty 30, 30d supply, fill #1
  Filled 2021-03-22: qty 30, 30d supply, fill #2
  Filled 2021-04-19: qty 30, 30d supply, fill #3

## 2021-01-21 ENCOUNTER — Other Ambulatory Visit (HOSPITAL_COMMUNITY): Payer: Self-pay

## 2021-01-21 MED ORDER — METOCLOPRAMIDE HCL 10 MG PO TABS
ORAL_TABLET | ORAL | 0 refills | Status: DC
Start: 2021-01-21 — End: 2021-01-29
  Filled 2021-01-21: qty 40, 10d supply, fill #0

## 2021-01-29 ENCOUNTER — Other Ambulatory Visit (HOSPITAL_COMMUNITY): Payer: Self-pay

## 2021-01-29 MED ORDER — METOCLOPRAMIDE HCL 10 MG PO TABS
ORAL_TABLET | ORAL | 0 refills | Status: DC
  Filled 2021-01-29 – 2021-02-15 (×2): qty 40, 10d supply, fill #0

## 2021-02-01 ENCOUNTER — Other Ambulatory Visit: Payer: Self-pay

## 2021-02-01 ENCOUNTER — Encounter: Payer: Self-pay | Admitting: Emergency Medicine

## 2021-02-01 ENCOUNTER — Ambulatory Visit
Admission: EM | Admit: 2021-02-01 | Discharge: 2021-02-01 | Disposition: A | Discharge status: Home / Self Care | Payer: 59 | Attending: Sports Medicine | Admitting: Sports Medicine

## 2021-02-01 DIAGNOSIS — M549 Dorsalgia, unspecified: Secondary | ICD-10-CM | POA: Insufficient documentation

## 2021-02-01 DIAGNOSIS — M62830 Muscle spasm of back: Secondary | ICD-10-CM | POA: Diagnosis not present

## 2021-02-01 LAB — URINALYSIS, COMPLETE (UACMP) WITH MICROSCOPIC
Bilirubin Urine: NEGATIVE
Glucose, UA: NEGATIVE mg/dL
Ketones, ur: NEGATIVE mg/dL
Leukocytes,Ua: NEGATIVE
Nitrite: NEGATIVE
Protein, ur: NEGATIVE mg/dL
Specific Gravity, Urine: 1.015 (ref 1.005–1.030)
pH: 5.5 (ref 5.0–8.0)

## 2021-02-01 MED ORDER — DICLOFENAC SODIUM 75 MG PO TBEC: 75.0000 mg | DELAYED_RELEASE_TABLET | Freq: Two times a day (BID) | ORAL | 1 refills | Status: AC

## 2021-02-01 MED ORDER — BACLOFEN 10 MG PO TABS: 10.0000 mg | ORAL_TABLET | Freq: Three times a day (TID) | ORAL | 0 refills | Status: AC | PRN

## 2021-02-01 MED ORDER — KETOROLAC TROMETHAMINE 60 MG/2ML IM SOLN
60.0000 mg | Freq: Once | INTRAMUSCULAR | Status: AC
  Administered 2021-02-01: 60 mg via INTRAMUSCULAR

## 2021-02-01 NOTE — ED Provider Notes (Signed)
[facility]    CSN: [id number] Arrival date & time: 02/01/21  0948      History   Chief Complaint Chief Complaint  Patient presents with   Back Pain    HPI Tracy Chang is a 35 y.o. female presenting for atraumatic mid bilateral back pain and spasms that started last night and have worsened today.  Patient says that she has a dull ache across her back and then occasional sharp stabbing pains.  States "it feels like somebody is getting you with an axe."  She says that rotating/twisting and flexing forward increase her pain.  She denies any radiation of pain to her lower extremities and has not had any numbness, weakness or tingling.  When asked about urinary symptoms, patient says that she had "a little burning the other day."  No urinary frequency urgency.  No hematuria.  No fever, chills, nausea or vomiting.  No abdominal pain.  Patient has taken 1 g of Tylenol without any improvement in her pain.  No other complaints.  HPI  Past Medical History:  Diagnosis Date   Anxiety    Asthma    Complication of anesthesia    Conscious sedation-extreme agitation   Pregnancy induced hypertension     Patient Active Problem List   Diagnosis Date Noted   Anemia associated with acute blood loss EBL 724 08/07/2020   Anxiety 08/06/2020   Asthma 08/06/2020   Failure to progress in labor 08/06/2020   Status post primary low transverse cesarean section 12/27 08/06/2020   Postpartum care following cesarean delivery 12/27 08/06/2020   Morbid obesity with body mass index (BMI) of 60.0 to 69.9 in adult [facility]) 08/06/2020   Gestational hypertension 08/05/2020    Past Surgical History:  Procedure Laterality Date   CESAREAN SECTION N/A 08/06/2020   Procedure: CESAREAN SECTION;  Surgeon: Olivia Mackie, MD;  Location: [facility] LD ORS;  Service: Obstetrics;  Laterality: N/A;   CHOLECYSTECTOMY     TONSILLECTOMY      OB History     Gravida  2   Para  1   Term  1   Preterm       AB  1   Living  1      SAB      IAB      Ectopic      Multiple  0   Live Births  1            Home Medications    Prior to Admission medications   Medication Sig Start Date End Date Taking? Authorizing Provider  baclofen (LIORESAL) 10 MG tablet Take 1 tablet (10 mg total) by mouth 3 (three) times daily as needed for up to 15 days for muscle spasms. 02/01/21 02/16/21 Yes Shirlee Latch, PA-C  cetirizine (ZYRTEC) 10 MG tablet Take 10 mg by mouth daily.   Yes [provider]  diclofenac (VOLTAREN) 75 MG EC tablet Take 1 tablet (75 mg total) by mouth 2 (two) times daily for 15 days. 02/01/21 02/16/21 Yes Shirlee Latch, PA-C  ibuprofen (ADVIL) 800 MG tablet Take 1 tablet (800 mg total) by mouth every 6 (six) hours. 08/09/20  Yes June Leap, CNM  metoCLOPramide (REGLAN) 10 MG tablet Take 1 tablet by mouth 4 times a day for 10 days. 01/29/21  Yes   Prenatal Vit-Fe Fumarate-FA (PRENATAL MULTIVITAMIN) TABS tablet Take 1 tablet by mouth daily at 12 noon.   Yes [provider]  sertraline (ZOLOFT) 100 MG  tablet Take 1 tablet (100 mg total) by mouth daily. Patient taking differently: Take 150 mg by mouth daily. 10/11/20  Yes   sertraline (ZOLOFT) 50 MG tablet TAKE 1 TABLET BY MOUTH ONCE A DAY 09/03/20 09/03/21 Yes Neta Mends, CNM  Sertraline HCl 150 MG CAPS Take 1 capsule by mouth once daily as directed 01/18/21  Yes June Leap, CNM  albuterol (VENTOLIN HFA) 108 (90 Base) MCG/ACT inhaler Inhale 1-2 puffs into the lungs every 6 (six) hours as needed for wheezing or shortness of breath.    [provider]  coconut oil OIL Apply 1 application topically as needed. 08/09/20   June Leap, CNM  hydrochlorothiazide (HYDRODIURIL) 25 MG tablet Take 1 tablet (25 mg total) by mouth daily for 5 days. 08/09/20 08/14/20  June Leap, CNM  hydrochlorothiazide (HYDRODIURIL) 25 MG tablet TAKE 1 TABLET BY MOUTH 2 TIMES DAILY FOR 5 DAYS 08/14/20 08/14/21  June Leap, CNM  iron polysaccharides (FERREX 150) 150 MG capsule Take 1 capsule (150 mg total) by mouth daily. 08/09/20   June Leap, CNM  magnesium oxide (MAG-OX) 400 (241.3 Mg) MG tablet Take 1 tablet (400 mg total) by mouth daily. 08/10/20   June Leap, CNM  NIFEdipine (ADALAT CC) 30 MG 24 hr tablet TAKE 1 TABLET BY MOUTH DAILY FOR 30 DAYS 08/14/20 08/14/21  June Leap, CNM  oxyCODONE-acetaminophen (PERCOCET/ROXICET) 5-325 MG tablet Take 1-2 tablets by mouth every 4 (four) hours as needed for moderate pain. 08/09/20   June Leap, CNM  sertraline (ZOLOFT) 100 MG tablet Take 1 tablet (100 mg total) by mouth daily. 10/11/20       Family History Family History  Problem Relation Age of Onset   Hypertension Mother     Social History Social History   Tobacco Use   Smoking status: Never   Smokeless tobacco: Never  Vaping Use   Vaping Use: Never used  Substance Use Topics   Alcohol use: Not Currently    Comment: SOCIAL   Drug use: Never     Allergies   Augmentin [amoxicillin-pot clavulanate], Ceclor [cefaclor], Demerol [meperidine], Latex, Versed [midazolam], and Compazine [prochlorperazine]   Review of Systems Review of Systems  Constitutional:  Negative for fatigue and fever.  Gastrointestinal:  Negative for abdominal pain, nausea and vomiting.  Genitourinary:  Positive for dysuria. Negative for flank pain, hematuria and urgency.  Musculoskeletal:  Positive for back pain. Negative for gait problem.  Neurological:  Negative for weakness and numbness.    Physical Exam Triage Vital Signs ED Triage Vitals  Enc Vitals Group     BP 02/01/21 1026 (!) 97/56     Pulse Rate 02/01/21 1026 64     Resp 02/01/21 1026 15     Temp 02/01/21 1026 98.1 F (36.7 C)     Temp Source 02/01/21 1026 Oral     SpO2 02/01/21 1026 95 %     Weight 02/01/21 1022 285 lb (129.3 kg)     Height 02/01/21 1022 5\' 1"  (1.549 m)     Head Circumference --      Peak Flow --      Pain Score 02/01/21  1022 9     Pain Loc --      Pain Edu? --      Excl. in GC? --    No data found.  Updated Vital Signs BP (!) 97/56 (BP Location: Left Arm)   Pulse 64   Temp 98.1 F (  36.7 C) (Oral)   Resp 15   Ht 5\' 1"  (1.549 m)   Wt 285 lb (129.3 kg)   SpO2 95%   Breastfeeding Yes   BMI 53.85 kg/m       Physical Exam Vitals and nursing note reviewed.  Constitutional:      General: She is not in acute distress.    Appearance: Normal appearance. She is obese. She is not ill-appearing or toxic-appearing.  HENT:     Head: Normocephalic and atraumatic.  Eyes:     General: No scleral icterus.       Right eye: No discharge.        Left eye: No discharge.     Conjunctiva/sclera: Conjunctivae normal.  Cardiovascular:     Rate and Rhythm: Normal rate and regular rhythm.     Heart sounds: Normal heart sounds.  Pulmonary:     Effort: Pulmonary effort is normal. No respiratory distress.     Breath sounds: Normal breath sounds.  Abdominal:     Palpations: Abdomen is soft.     Tenderness: There is no abdominal tenderness. There is no right CVA tenderness or left CVA tenderness.  Musculoskeletal:     Cervical back: Neck supple.     Lumbar back: Spasms and tenderness (TTP bilateral thoracolumbar region) present. No bony tenderness. Decreased range of motion.       Back:  Skin:    General: Skin is dry.  Neurological:     General: No focal deficit present.     Mental Status: She is alert. Mental status is at baseline.     Motor: No weakness.     Gait: Gait normal.  Psychiatric:        Mood and Affect: Mood normal.        Behavior: Behavior normal.        Thought Content: Thought content normal.     UC Treatments / Results  Labs (all labs ordered are listed, but only abnormal results are displayed) Labs Reviewed  URINALYSIS, COMPLETE (UACMP) WITH MICROSCOPIC - Abnormal; Notable for the following components:      Result Value   Hgb urine dipstick TRACE (*)    Bacteria, UA FEW (*)     All other components within normal limits  URINE CULTURE    EKG   Radiology No results found.  Procedures Procedures (including critical care time)  Medications Ordered in UC Medications  ketorolac (TORADOL) injection 60 mg (60 mg Intramuscular Given 02/01/21 1052)    Initial Impression / Assessment and Plan / UC Course  I have reviewed the triage vital signs and the nursing notes.  Pertinent labs & imaging results that were available during my care of the patient were reviewed by me and considered in my medical decision making (see chart for details).  35 year old female presenting for back pain with onset yesterday.  No injury or trauma.  Exam significant for tenderness of the thoracolumbar region bilaterally.  No spinal tenderness.  Increased pain with flexion and rotation.  No red flag signs or symptoms.  Patient has tried Tylenol without relief.  Vitals are all stable in clinic and she is well-appearing but does appear to be in pain.  Patient given 60 mg IM ketorolac in clinic for pain.  Patient requested this.  Urinalysis performed due to her complaint of dysuria the other day.  Urinalysis shows trace blood, otherwise normal.  We will send urine for culture and treat for UTI if culture is positive, but unlikely.  Treating musculoskeletal back pain at this time with baclofen since she is breast-feeding also diclofenac sodium.  Also encouraged her to take Tylenol use warm compresses.  Reviewed ED red flag signs and symptoms with patient.  Work note given.   Final Clinical Impressions(s) / UC Diagnoses   Final diagnoses:  Muscle spasm of back  Acute bilateral back pain, unspecified back location     Discharge Instructions      Your back pain is due to musculoskeletal cause.  It is consistent with muscle spasms.  I have sent in baclofen.  This is a muscle relaxer that is one of the most safest to use with someone who is breast-feeding.  Unfortunately, cyclobenzaprine or  Flexeril is not really recommended.  I have also sent in diclofenac sodium which is an anti-inflammatory medication.  You can take this for pain and also Tylenol if you need to.  I would advise using warm compresses.  You should start to feel better over the next week or 2 but if your symptoms worsen you need to be seen again.  For any acute worsening of symptoms please go to the emergency department.  If you have any sudden numbness or weakness in your legs or bowel or bladder problems you should call 911 or have someone take you to the ED.     ED Prescriptions     Medication Sig Dispense Auth. Provider   diclofenac (VOLTAREN) 75 MG EC tablet Take 1 tablet (75 mg total) by mouth 2 (two) times daily for 15 days. 30 tablet Eusebio FriendlyEaves, Janelle Spellman B, PA-C   baclofen (LIORESAL) 10 MG tablet Take 1 tablet (10 mg total) by mouth 3 (three) times daily as needed for up to 15 days for muscle spasms. 30 tablet Shirlee LatchEaves, Alexsis Branscom B, PA-C      I have reviewed the PDMP during this encounter.   Shirlee Latchaves, Orel Cooler B, PA-C 02/01/21 1115

## 2021-02-01 NOTE — ED Triage Notes (Signed)
Patient c/o mid back pain and spasms that started last night.  Patient states her pain has gotten worse.  Patient has taken Tylenol.  Patient reports some burning when urinating.

## 2021-02-01 NOTE — Discharge Instructions (Addendum)
Your back pain is due to musculoskeletal cause.  It is consistent with muscle spasms.  I have sent in baclofen.  This is a muscle relaxer that is one of the most safest to use with someone who is breast-feeding.  Unfortunately, cyclobenzaprine or Flexeril is not really recommended.  I have also sent in diclofenac sodium which is an anti-inflammatory medication.  You can take this for pain and also Tylenol if you need to.  I would advise using warm compresses.  You should start to feel better over the next week or 2 but if your symptoms worsen you need to be seen again.  For any acute worsening of symptoms please go to the emergency department.  If you have any sudden numbness or weakness in your legs or bowel or bladder problems you should call 911 or have someone take you to the ED.

## 2021-02-03 LAB — URINE CULTURE

## 2021-02-06 ENCOUNTER — Other Ambulatory Visit (HOSPITAL_COMMUNITY): Payer: Self-pay

## 2021-02-08 DIAGNOSIS — Z1283 Encounter for screening for malignant neoplasm of skin: Secondary | ICD-10-CM | POA: Diagnosis not present

## 2021-02-08 DIAGNOSIS — L918 Other hypertrophic disorders of the skin: Secondary | ICD-10-CM | POA: Diagnosis not present

## 2021-02-15 ENCOUNTER — Other Ambulatory Visit (HOSPITAL_COMMUNITY): Payer: Self-pay

## 2021-02-18 ENCOUNTER — Other Ambulatory Visit (HOSPITAL_COMMUNITY): Payer: Self-pay

## 2021-03-01 ENCOUNTER — Other Ambulatory Visit (HOSPITAL_COMMUNITY): Payer: Self-pay

## 2021-03-01 MED ORDER — METOCLOPRAMIDE HCL 10 MG PO TABS
ORAL_TABLET | ORAL | 0 refills | Status: DC
  Filled 2021-03-01 – 2021-03-18 (×2): qty 40, 10d supply, fill #0

## 2021-03-11 ENCOUNTER — Other Ambulatory Visit (HOSPITAL_COMMUNITY): Payer: Self-pay

## 2021-03-13 DIAGNOSIS — R309 Painful micturition, unspecified: Secondary | ICD-10-CM | POA: Diagnosis not present

## 2021-03-18 ENCOUNTER — Other Ambulatory Visit (HOSPITAL_COMMUNITY): Payer: Self-pay

## 2021-03-22 ENCOUNTER — Other Ambulatory Visit (HOSPITAL_COMMUNITY): Payer: Self-pay

## 2021-03-25 ENCOUNTER — Other Ambulatory Visit (HOSPITAL_COMMUNITY): Payer: Self-pay

## 2021-04-10 ENCOUNTER — Other Ambulatory Visit (HOSPITAL_COMMUNITY): Payer: Self-pay

## 2021-04-10 MED ORDER — METOCLOPRAMIDE HCL 10 MG PO TABS
ORAL_TABLET | ORAL | 0 refills | Status: DC
  Filled 2021-04-10: qty 40, 10d supply, fill #0

## 2021-04-19 ENCOUNTER — Other Ambulatory Visit (HOSPITAL_COMMUNITY): Payer: Self-pay

## 2021-04-22 ENCOUNTER — Other Ambulatory Visit (HOSPITAL_COMMUNITY): Payer: Self-pay

## 2021-04-25 ENCOUNTER — Other Ambulatory Visit (HOSPITAL_COMMUNITY): Payer: Self-pay

## 2021-05-24 DIAGNOSIS — J208 Acute bronchitis due to other specified organisms: Secondary | ICD-10-CM | POA: Diagnosis not present

## 2021-05-24 DIAGNOSIS — Z20822 Contact with and (suspected) exposure to covid-19: Secondary | ICD-10-CM | POA: Diagnosis not present

## 2021-05-24 DIAGNOSIS — B9689 Other specified bacterial agents as the cause of diseases classified elsewhere: Secondary | ICD-10-CM | POA: Diagnosis not present

## 2021-06-04 ENCOUNTER — Other Ambulatory Visit (HOSPITAL_COMMUNITY): Payer: Self-pay

## 2021-06-04 MED ORDER — SERTRALINE HCL 150 MG PO CAPS
150.0000 mg | ORAL_CAPSULE | Freq: Every day | ORAL | 3 refills | Status: DC
  Filled 2021-06-04: qty 30, 30d supply, fill #0
  Filled 2021-07-15: qty 30, 30d supply, fill #1
  Filled 2021-08-29 (×2): qty 30, 30d supply, fill #2

## 2021-06-05 ENCOUNTER — Other Ambulatory Visit (HOSPITAL_COMMUNITY): Payer: Self-pay

## 2021-06-07 ENCOUNTER — Inpatient Hospital Stay: Payer: 59 | Attending: Family Medicine

## 2021-06-07 ENCOUNTER — Other Ambulatory Visit (HOSPITAL_COMMUNITY): Payer: Self-pay

## 2021-06-07 ENCOUNTER — Other Ambulatory Visit: Payer: Self-pay

## 2021-06-07 DIAGNOSIS — Z23 Encounter for immunization: Secondary | ICD-10-CM | POA: Insufficient documentation

## 2021-06-07 MED ORDER — INFLUENZA VAC SPLIT QUAD 0.5 ML IM SUSY
0.5000 mL | PREFILLED_SYRINGE | Freq: Once | INTRAMUSCULAR | Status: AC
  Administered 2021-06-07: 0.5 mL via INTRAMUSCULAR
  Filled 2021-06-07: qty 0.5

## 2021-06-07 NOTE — Patient Instructions (Signed)
Influenza, Adult °Influenza, also called "the flu," is a viral infection that mainly affects the respiratory tract. This includes the lungs, nose, and throat. The flu spreads easily from person to person (is contagious). It causes common cold symptoms, along with high fever and body aches. °What are the causes? °This condition is caused by the influenza virus. You can get the virus by: °Breathing in droplets that are in the air from an infected person's cough or sneeze. °Touching something that has the virus on it (has been contaminated) and then touching your mouth, nose, or eyes. °What increases the risk? °The following factors may make you more likely to get the flu: °Not washing or sanitizing your hands often. °Having close contact with many people during cold and flu season. °Touching your mouth, eyes, or nose without first washing or sanitizing your hands. °Not getting an annual flu shot. °You may have a higher risk for the flu, including serious problems, such as a lung infection (pneumonia), if you: °Are older than 65. °Are pregnant. °Have a weakened disease-fighting system (immune system). This includes people who have HIV or AIDS, are on chemotherapy, or are taking medicines that reduce (suppress) the immune system. °Have a long-term (chronic) illness, such as heart disease, kidney disease, diabetes, or lung disease. °Have a liver disorder. °Are severely overweight (morbidly obese). °Have anemia. °Have asthma. °What are the signs or symptoms? °Symptoms of this condition usually begin suddenly and last 4-14 days. These may include: °Fever and chills. °Headaches, body aches, or muscle aches. °Sore throat. °Cough. °Runny or stuffy (congested) nose. °Chest discomfort. °Poor appetite. °Weakness or fatigue. °Dizziness. °Nausea or vomiting. °How is this diagnosed? °This condition may be diagnosed based on: °Your symptoms and medical history. °A physical exam. °Swabbing your nose or throat and testing the fluid  for the influenza virus. °How is this treated? °If the flu is diagnosed early, you can be treated with antiviral medicine that is given by mouth (orally) or through an IV. This can help reduce how severe the illness is and how long it lasts. °Taking care of yourself at home can help relieve symptoms. Your health care provider may recommend: °Taking over-the-counter medicines. °Drinking plenty of fluids. °In many cases, the flu goes away on its own. If you have severe symptoms or complications, you may be treated in a hospital. °Follow these instructions at home: °Activity °Rest as needed and get plenty of sleep. °Stay home from work or school as told by your health care provider. Unless you are visiting your health care provider, avoid leaving home until your fever has been gone for 24 hours without taking medicine. °Eating and drinking °Take an oral rehydration solution (ORS). This is a drink that is sold at pharmacies and retail stores. °Drink enough fluid to keep your urine pale yellow. °Drink clear fluids in small amounts as you are able. Clear fluids include water, ice chips, fruit juice mixed with water, and low-calorie sports drinks. °Eat bland, easy-to-digest foods in small amounts as you are able. These foods include bananas, applesauce, rice, lean meats, toast, and crackers. °Avoid drinking fluids that contain a lot of sugar or caffeine, such as energy drinks, regular sports drinks, and soda. °Avoid alcohol. °Avoid spicy or fatty foods. °General instructions °  °Take over-the-counter and prescription medicines only as told by your health care provider. °Use a cool mist humidifier to add humidity to the air in your home. This can make it easier to breathe. °When using a cool mist humidifier,   clean it daily. Empty the water and replace it with clean water. °Cover your mouth and nose when you cough or sneeze. °Wash your hands with soap and water often and for at least 20 seconds, especially after you cough or  sneeze. If soap and water are not available, use alcohol-based hand sanitizer. °Keep all follow-up visits. This is important. °How is this prevented? ° °Get an annual flu shot. This is usually available in late summer, fall, or winter. Ask your health care provider when you should get your flu shot. °Avoid contact with people who are sick during cold and flu season. This is generally fall and winter. °Contact a health care provider if: °You develop new symptoms. °You have: °Chest pain. °Diarrhea. °A fever. °Your cough gets worse. °You produce more mucus. °You feel nauseous or you vomit. °Get help right away if you: °Develop shortness of breath or have difficulty breathing. °Have skin or nails that turn a bluish color. °Have severe pain or stiffness in your neck. °Develop a sudden headache or sudden pain in your face or ear. °Cannot eat or drink without vomiting. °These symptoms may represent a serious problem that is an emergency. Do not wait to see if the symptoms will go away. Get medical help right away. Call your local emergency services (911 in the [place] Do not drive yourself to the hospital. °Summary °Influenza, also called "the flu," is a viral infection that primarily affects your respiratory tract. °Symptoms of the flu usually begin suddenly and last 4-14 days. °Getting an annual flu shot is the best way to prevent getting the flu. °Stay home from work or school as told by your health care provider. Unless you are visiting your health care provider, avoid leaving home until your fever has been gone for 24 hours without taking medicine. °Keep all follow-up visits. This is important. °This information is not intended to replace advice given to you by your health care provider. Make sure you discuss any questions you have with your health care provider. °Document Revised: 03/16/2020 Document Reviewed: 03/16/2020 °Elsevier Patient Education © 2022 [employer] ° °

## 2021-06-08 ENCOUNTER — Other Ambulatory Visit (HOSPITAL_COMMUNITY): Payer: Self-pay

## 2021-07-15 ENCOUNTER — Other Ambulatory Visit (HOSPITAL_COMMUNITY): Payer: Self-pay

## 2021-07-16 ENCOUNTER — Other Ambulatory Visit (HOSPITAL_COMMUNITY): Payer: Self-pay

## 2021-08-29 ENCOUNTER — Other Ambulatory Visit (HOSPITAL_COMMUNITY): Payer: Self-pay

## 2021-11-18 DIAGNOSIS — Z0289 Encounter for other administrative examinations: Secondary | ICD-10-CM

## 2021-12-02 NOTE — Progress Notes (Signed)
? ? Tracy Chang D.Merril Abbe ?[facility] ?[place] ?Phone: [phone] ?  ?Assessment and Plan:   ?  ?1. Chronic bilateral low back pain with left-sided sciatica ?-Chronic with exacerbation, initial sports medicine visit ?- Likely sciatica of gluteal/piriformis origin based on HPI, physical exam, 35-month-old baby that patient typically carries on her left side ?- Start HEP.  Provided ?-Tomorrow, start meloxicam 15 mg daily x2 weeks.  If still having pain after 2 weeks, complete 3rd-week of meloxicam. May use remaining meloxicam as needed once daily for pain control.  Do not to use additional NSAIDs while taking meloxicam.  May use Tylenol [phone] mg 2 to 3 times a day for breakthrough pain.  ?-Start Flexeril 10 mg 3 times a day as needed for muscle spasms.  Prescription provided today ?- Patient given ketorolac 60 mg/methylprednisone 80 mg IM injection in clinic today ? ?Pertinent previous records reviewed include none ?  ?Follow Up: 2 to 3 weeks for reevaluation.  Could consider x-ray if no improvement or worsening of symptoms at that time.  Could also consider OMT ?  ?Subjective:   ?I, Pincus Badder, am serving as a Education administrator for [employer] ? ?Chief Complaint: sciatica pain  ? ?HPI:  ? ?12/03/2021 ?Patient is a 36 year old female complaining of sciatic pain. Patient states that for a week and a half she has had left side sciatic pain yesterday it started moving to the right , she thinks it could be from the epidural she had when she had her baby, walking hurts a lot , pain radiating down left leg down to the ankle, has been taking tylenol ib , heating pad, ice , diclofenac, and flexeril and nothing is working ? ?Relevant Historical Information: Elevated BMI,  ? ?Additional pertinent review of systems negative. ? ? ?Current Outpatient Medications:  ?  albuterol (VENTOLIN HFA) 108 (90 Base) MCG/ACT inhaler, Inhale 1-2 puffs into the lungs every 6  (six) hours as needed for wheezing or shortness of breath., Disp: , Rfl:  ?  ALPRAZolam (XANAX) 0.5 MG tablet, Take 0.5 mg by mouth at bedtime as needed for anxiety., Disp: , Rfl:  ?  cetirizine (ZYRTEC) 10 MG tablet, Take 10 mg by mouth daily., Disp: , Rfl:  ?  ibuprofen (ADVIL) 800 MG tablet, Take 1 tablet (800 mg total) by mouth every 6 (six) hours., Disp: 30 tablet, Rfl: 0 ?  levonorgestrel (MIRENA) 20 MCG/DAY IUD, 1 each by Intrauterine route once., Disp: , Rfl:  ?  Prenatal Vit-Fe Fumarate-FA (PRENATAL MULTIVITAMIN) TABS tablet, Take 1 tablet by mouth daily at 12 noon., Disp: , Rfl:  ?  sertraline (ZOLOFT) 100 MG tablet, Take 1 tablet (100 mg total) by mouth daily. (Patient taking differently: Take 150 mg by mouth daily.), Disp: 60 tablet, Rfl: 0 ?  valACYclovir (VALTREX) 1000 MG tablet, Take 1,000 mg by mouth 2 (two) times daily., Disp: , Rfl:  ?  hydrochlorothiazide (HYDRODIURIL) 25 MG tablet, Take 1 tablet (25 mg total) by mouth daily for 5 days., Disp: 5 tablet, Rfl: 0  ? ?Objective:   ?  ?Vitals:  ? 12/03/21 0944  ?BP: 112/75  ?Pulse: 77  ?SpO2: 95%  ?Weight: (!) 319 lb (144.7 kg)  ?Height: 5\' 2"  (1.575 m)  ?  ?  ?Body mass index is 58.35 kg/m?.  ?  ?Physical Exam:   ? ?Gen: Appears well, nad, nontoxic and pleasant ?Psych: Alert and oriented, appropriate mood and affect ?Neuro: sensation  intact, strength is 5/5 in upper and lower extremities, muscle tone wnl ?Skin: no susupicious lesions or rashes ? ?Back - Normal skin, Spine with normal alignment and no deformity.   ?No tenderness to vertebral process palpation.   ?Paraspinous muscles are mildly tender on left and without spasm ?Straight leg raise negative ?Piriformis test positive on left, negative on right ?Left-sided TTP greater trochanter, gluteal musculature ? ? ?Electronically signed by:  ?Tracy Chang D.Merril Abbe ?[facility] ?10:01 AM 12/03/21 ?

## 2021-12-03 ENCOUNTER — Ambulatory Visit (INDEPENDENT_AMBULATORY_CARE_PROVIDER_SITE_OTHER): Payer: 59 | Admitting: Family Medicine

## 2021-12-03 ENCOUNTER — Ambulatory Visit (INDEPENDENT_AMBULATORY_CARE_PROVIDER_SITE_OTHER): Payer: 59 | Admitting: Sports Medicine

## 2021-12-03 ENCOUNTER — Encounter (INDEPENDENT_AMBULATORY_CARE_PROVIDER_SITE_OTHER): Payer: Self-pay | Admitting: Family Medicine

## 2021-12-03 VITALS — BP 112/75 | HR 77 | Temp 98.0°F | Ht 62.0 in | Wt 319.0 lb

## 2021-12-03 VITALS — BP 112/75 | HR 77 | Ht 62.0 in | Wt 319.0 lb

## 2021-12-03 DIAGNOSIS — O133 Gestational [pregnancy-induced] hypertension without significant proteinuria, third trimester: Secondary | ICD-10-CM

## 2021-12-03 DIAGNOSIS — R5383 Other fatigue: Secondary | ICD-10-CM

## 2021-12-03 DIAGNOSIS — E782 Mixed hyperlipidemia: Secondary | ICD-10-CM | POA: Diagnosis not present

## 2021-12-03 DIAGNOSIS — G8929 Other chronic pain: Secondary | ICD-10-CM | POA: Diagnosis not present

## 2021-12-03 DIAGNOSIS — Z9189 Other specified personal risk factors, not elsewhere classified: Secondary | ICD-10-CM

## 2021-12-03 DIAGNOSIS — Z6841 Body Mass Index (BMI) 40.0 and over, adult: Secondary | ICD-10-CM

## 2021-12-03 DIAGNOSIS — F988 Other specified behavioral and emotional disorders with onset usually occurring in childhood and adolescence: Secondary | ICD-10-CM

## 2021-12-03 DIAGNOSIS — Z1331 Encounter for screening for depression: Secondary | ICD-10-CM

## 2021-12-03 DIAGNOSIS — F411 Generalized anxiety disorder: Secondary | ICD-10-CM

## 2021-12-03 DIAGNOSIS — K760 Fatty (change of) liver, not elsewhere classified: Secondary | ICD-10-CM | POA: Diagnosis not present

## 2021-12-03 DIAGNOSIS — R739 Hyperglycemia, unspecified: Secondary | ICD-10-CM | POA: Diagnosis not present

## 2021-12-03 DIAGNOSIS — M5442 Lumbago with sciatica, left side: Secondary | ICD-10-CM

## 2021-12-03 DIAGNOSIS — R0602 Shortness of breath: Secondary | ICD-10-CM | POA: Diagnosis not present

## 2021-12-03 DIAGNOSIS — E669 Obesity, unspecified: Secondary | ICD-10-CM

## 2021-12-03 MED ORDER — METHYLPREDNISOLONE ACETATE 80 MG/ML IJ SUSP
80.0000 mg | Freq: Once | INTRAMUSCULAR | Status: AC
  Administered 2021-12-03: 80 mg via INTRAMUSCULAR

## 2021-12-03 MED ORDER — MELOXICAM 15 MG PO TABS: 15.0000 mg | ORAL_TABLET | Freq: Every day | ORAL | 0 refills | Status: DC

## 2021-12-03 MED ORDER — KETOROLAC TROMETHAMINE 60 MG/2ML IM SOLN
60.0000 mg | Freq: Once | INTRAMUSCULAR | Status: AC
  Administered 2021-12-03: 60 mg via INTRAMUSCULAR

## 2021-12-03 MED ORDER — CYCLOBENZAPRINE HCL 10 MG PO TABS: 10.0000 mg | ORAL_TABLET | Freq: Three times a day (TID) | ORAL | 0 refills | Status: DC | PRN

## 2021-12-03 NOTE — Patient Instructions (Addendum)
Good to see you  ?- Start meloxicam 15 mg daily x2 weeks.  If still having pain after 2 weeks, complete 3rd-week of meloxicam. May use remaining meloxicam as needed once daily for pain control.  Do not to use additional NSAIDs while taking meloxicam.  May use Tylenol [phone] mg 2 to 3 times a day for breakthrough pain. ?Recommend you start meloxicam tomorrow  ?Flexeril 10 mg 3 times a day  ?Low back sciatic HEP  ?2-3 week follow up  ? ? ? ?

## 2021-12-03 NOTE — Addendum Note (Signed)
Addended by: Pollyann Glen on: 12/03/2021 10:18 AM ? ? Modules accepted: Orders ? ?

## 2021-12-04 LAB — COMPREHENSIVE METABOLIC PANEL
ALT: 22 IU/L (ref 0–32)
AST: 18 IU/L (ref 0–40)
Albumin/Globulin Ratio: 2 (ref 1.2–2.2)
Albumin: 4.3 g/dL (ref 3.8–4.8)
Alkaline Phosphatase: 57 IU/L (ref 44–121)
BUN/Creatinine Ratio: 17 (ref 9–23)
BUN: 11 mg/dL (ref 6–20)
Bilirubin Total: <0.2 mg/dL (ref 0.0–1.2)
CO2: 24 mmol/L (ref 20–29)
Calcium: 9.1 mg/dL (ref 8.7–10.2)
Chloride: 102 mmol/L (ref 96–106)
Creatinine, Ser: 0.64 mg/dL (ref 0.57–1.00)
Globulin, Total: 2.2 g/dL (ref 1.5–4.5)
Glucose: 102 mg/dL — ABNORMAL HIGH (ref 70–99)
Potassium: 4.6 mmol/L (ref 3.5–5.2)
Sodium: 139 mmol/L (ref 134–144)
Total Protein: 6.5 g/dL (ref 6.0–8.5)
eGFR: 117 mL/min/{1.73_m2} (ref >59)

## 2021-12-04 LAB — INSULIN, RANDOM: INSULIN: 50.5 u[IU]/mL — ABNORMAL HIGH (ref 2.6–24.9)

## 2021-12-04 LAB — CBC WITH DIFFERENTIAL/PLATELET
Basophils Absolute: 0 10*3/uL (ref 0.0–0.2)
Basos: 1 %
EOS (ABSOLUTE): 0.3 10*3/uL (ref 0.0–0.4)
Eos: 4 %
Hematocrit: 40.3 % (ref 34.0–46.6)
Hemoglobin: 13.3 g/dL (ref 11.1–15.9)
Immature Grans (Abs): 0 10*3/uL (ref 0.0–0.1)
Immature Granulocytes: 0 %
Lymphocytes Absolute: 1.3 10*3/uL (ref 0.7–3.1)
Lymphs: 20 %
MCH: 29.1 pg (ref 26.6–33.0)
MCHC: 33 g/dL (ref 31.5–35.7)
MCV: 88 fL (ref 79–97)
Monocytes Absolute: 0.4 10*3/uL (ref 0.1–0.9)
Monocytes: 6 %
Neutrophils Absolute: 4.6 10*3/uL (ref 1.4–7.0)
Neutrophils: 69 %
Platelets: 189 10*3/uL (ref 150–450)
RBC: 4.57 x10E6/uL (ref 3.77–5.28)
RDW: 13.1 % (ref 11.7–15.4)
WBC: 6.6 10*3/uL (ref 3.4–10.8)

## 2021-12-04 LAB — LIPID PANEL WITH LDL/HDL RATIO
Cholesterol, Total: 178 mg/dL (ref 100–199)
HDL: 44 mg/dL (ref >39)
LDL Chol Calc (NIH): 103 mg/dL — ABNORMAL HIGH (ref 0–99)
LDL/HDL Ratio: 2.3 ratio (ref 0.0–3.2)
Triglycerides: 176 mg/dL — ABNORMAL HIGH (ref 0–149)
VLDL Cholesterol Cal: 31 mg/dL (ref 5–40)

## 2021-12-04 LAB — HEMOGLOBIN A1C
Est. average glucose Bld gHb Est-mCnc: 117 mg/dL
Hgb A1c MFr Bld: 5.7 % — ABNORMAL HIGH (ref 4.8–5.6)

## 2021-12-04 LAB — VITAMIN B12: Vitamin B-12: 332 pg/mL (ref 232–1245)

## 2021-12-04 LAB — T4, FREE: Free T4: 0.85 ng/dL (ref 0.82–1.77)

## 2021-12-04 LAB — T3: T3, Total: 170 ng/dL (ref 71–180)

## 2021-12-04 LAB — FOLATE: Folate: 6.7 ng/mL (ref >3.0)

## 2021-12-04 LAB — VITAMIN D 25 HYDROXY (VIT D DEFICIENCY, FRACTURES): Vit D, 25-Hydroxy: 22.3 ng/mL — ABNORMAL LOW (ref 30.0–100.0)

## 2021-12-04 LAB — TSH: TSH: 3.59 u[IU]/mL (ref 0.450–4.500)

## 2021-12-11 ENCOUNTER — Encounter: Payer: Self-pay | Admitting: Sports Medicine

## 2021-12-11 ENCOUNTER — Encounter (INDEPENDENT_AMBULATORY_CARE_PROVIDER_SITE_OTHER): Payer: Self-pay | Admitting: Family Medicine

## 2021-12-11 NOTE — Telephone Encounter (Signed)
Updated pts allergy  list to include meloxicam  ?

## 2021-12-11 NOTE — Telephone Encounter (Signed)
Please see message and advise.  Thank you. ° °

## 2021-12-16 NOTE — Progress Notes (Signed)
Chief Complaint:   OBESITY Tracy Chang (MR# [id number]) is a 36 y.o. female who presents for evaluation and treatment of obesity and related comorbidities. Current BMI is Body mass index is 58.35 kg/m. Tracy Chang has been struggling with her weight for many years and has been unsuccessful in either losing weight, maintaining weight loss, or reaching her healthy weight goal.  Tracy Chang heard about our clinic at work. Her goal weight loss is for another pregnancy preferably at the end of the summer. 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 For breakfast she eat [employer] Steak, egg and cheese bagel with a large Vanilla Iced Coffee and hash Fullam (full). Granola bar at 10 am and a banana (hungry). 12 pm she has leftovers or cafeteria at [employer] or [employer] A sandwich, medium fries, and water (satisfied). For dinner 8 ounce steak, 1 cup of potatoes, 1 cup of green beans (stuffed) at dinner. 10 pm 2 heavenly hunks granola chunks and 1 glass of milk.   Tracy Chang is currently in the action stage of change and ready to dedicate time achieving and maintaining a healthier weight. Tracy Chang is interested in becoming our patient and working on intensive lifestyle modifications including (but not limited to) diet and exercise for weight loss.  Tracy Chang habits were reviewed today and are as follows: Her family eats meals together, she thinks her family will eat healthier with her, her desired weight loss is 139 pounds, she has been heavy most of her life, she started gaining weight after she finished breast feeding, her heaviest weight ever was 319 pounds, she has significant food cravings issues, she snacks frequently in the evenings, she skips meals frequently, she frequently makes poor food choices, she frequently eats larger portions than normal, and she struggles with emotional eating.  Depression Screen Tracy Chang Food and Mood (modified PHQ-9) score was 11.     12/03/2021    8:02 AM  Depression  screen PHQ 2/9  Decreased Interest 2  Down, Depressed, Hopeless 1  PHQ - 2 Score 3  Altered sleeping 2  Tired, decreased energy 3  Change in appetite 2  Feeling bad or failure about yourself  1  Trouble concentrating 0  Moving slowly or fidgety/restless 0  Suicidal thoughts 0  PHQ-9 Score 11  Difficult doing work/chores Somewhat difficult   Subjective:   1. Other fatigue Tracy Chang admits to daytime somnolence and admits to waking up still tired. Patient has a history of symptoms of daytime fatigue and morning fatigue. Tracy Chang generally gets  8.5  hours of sleep per night, and states that she has difficulty falling asleep and generally restful sleep. Snoring is present. Apneic episodes are not present. Epworth Sleepiness Score is 7.  Tracy Chang's EKG showed normal sinus rhythm at 76 bpm.   2. SOBOE (shortness of breath on exertion) Eura notes increasing shortness of breath with exercising and seems to be worsening over time with weight gain. She notes getting out of breath sooner with activity than she used to. This has not gotten worse recently. Tracy Chang denies shortness of breath at rest or orthopnea.   3. GAD (generalized anxiety disorder) Tracy Chang is currently taking Sertraline 150 mg and Xanax as needed. She was previously on lexapro and buspar. She feels well managed. She denies suicidal ideation or homicidal ideation.  4. Gestational hypertension, third trimester Tracy Chang was previously on nifedipine. Her blood pressure is well controlled today.   5. NAFLD (nonalcoholic fatty liver disease) Tracy Chang fatty liver  was elevated in 2019. Her recent LFTs was within normal limits.   6. Mixed hyperlipidemia Tracy Chang LDL was 103. Her Triglycerides was 155. Her HDL was 50. She is not on medications currently. Her labs was done in 2018.  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 Lilia Pro in childhood. 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   8. Hyperglycemia Tracy Chang hyperglycemia is likely due to pregnancy and delivery (stress induced).   9. At risk for deficient intake of food The patient is at a higher than average risk of deficient intake of food due to not eating enough.   Assessment/Plan:   1. Other fatigue Tracy Chang does feel that her weight is causing her energy to be lower than it should be. Fatigue may be related to obesity, depression or many other causes. Labs will be ordered, and in the meanwhile, Tracy Chang will focus on self care including making healthy food choices, increasing physical activity and focusing on stress reduction. We will review EKG. We will check IC and labs today.   - EKG 12-Lead - Vitamin B12 - Folate - T3 - T4, free - TSH - VITAMIN D 25 Hydroxy (Vit-D Deficiency, Fractures)  2. SOBOE (shortness of breath on exertion) Tracy Chang does feel that she gets out of breath more easily that she used to when she exercises. Baby's shortness of breath appears to be obesity related and exercise induced. She has agreed to work on weight loss and gradually increase exercise to treat her exercise induced shortness of breath. Will continue to monitor closely.  - CBC with Differential/Platelet  3. GAD (generalized anxiety disorder) Tracy Chang will continue currently medication dosage.   4. Gestational hypertension, third trimester We will review EKG.   5. NAFLD (nonalcoholic fatty liver disease) We discussed the likely diagnosis of non-alcoholic fatty liver disease today and how this condition is obesity related. Tracy Chang was educated the importance of weight loss. Tracy Chang agreed to continue with her weight loss efforts with healthier diet and exercise as an essential part of her treatment plan. We will check CMP today.   - Comprehensive metabolic panel  6. Mixed hyperlipidemia Cardiovascular risk and specific lipid/LDL goals reviewed.  We will check FLP today. We discussed several  lifestyle modifications today and Tracy Chang will continue to work on diet, exercise and weight loss efforts. Orders and follow up as documented in patient record.   Counseling Intensive lifestyle modifications are the first line treatment for this issue. Dietary changes: Increase soluble fiber. Decrease simple carbohydrates. Exercise changes: Moderate to vigorous-intensity aerobic activity 150 minutes per week if tolerated. Lipid-lowering medications: see documented in medical record.  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   8. Hyperglycemia We will check A1C and insulin level today.   - Hemoglobin A1c - Insulin, random  9. Depression screening Tracy Chang had a positive depression screening. Depression is commonly associated with obesity and often results in emotional eating behaviors. We will monitor this closely and work on CBT to help improve the non-hunger eating patterns. Referral to Psychology may be required if no improvement is seen as she continues in our clinic.   10. At risk for deficient intake of food Tracy Chang was given approximately 15 minutes of deficient intake of food prevention counseling today. Undine is at risk for eating too few calories based on current food recall. She  was encouraged to focus on meeting caloric and protein goals according to her recommended meal plan.   11. Class 3 severe obesity with serious comorbidity and body mass index (BMI) of 50.0 to 59.9 in adult, unspecified obesity type [facility]) Angelus is currently in the action stage of change and her goal is to continue with weight loss efforts. I recommend Mical begin the structured treatment plan as follows:  She has agreed to the Category 4 Plan plus 200 calories.   Exercise goals: No exercise has been prescribed at this time.   Behavioral modification strategies: increasing lean protein intake, meal  planning and cooking strategies, keeping healthy foods in the home, and planning for success.  She was informed of the importance of frequent follow-up visits to maximize her success with intensive lifestyle modifications for her multiple health conditions. She was informed we would discuss her lab results at her next visit unless there is a critical issue that needs to be addressed sooner. Temilola agreed to keep her next visit at the agreed upon time to discuss these results.  Objective:   Blood pressure 112/75, pulse 77, temperature 98 F (36.7 C), height 5\' 2"  (1.575 m), weight (!) 319 lb (144.7 kg), last menstrual period 11/11/2021, SpO2 95 %, currently breastfeeding. Body mass index is 58.35 kg/m.  EKG: Normal sinus rhythm, rate 76 bpm.  Indirect Calorimeter completed today shows a VO2 of 362 and a REE of 2506.  Her calculated basal metabolic rate is [id number] thus her basal metabolic rate is better than expected.  General: Cooperative, alert, well developed, in no acute distress. HEENT: Conjunctivae and lids unremarkable. Cardiovascular: Regular rhythm.  Lungs: Normal work of breathing. Neurologic: No focal deficits.   Lab Results  Component Value Date   CREATININE 0.64 12/03/2021   BUN 11 12/03/2021   NA 139 12/03/2021   K 4.6 12/03/2021   CL 102 12/03/2021   CO2 24 12/03/2021   Lab Results  Component Value Date   ALT 22 12/03/2021   AST 18 12/03/2021   ALKPHOS 57 12/03/2021   BILITOT <0.2 12/03/2021   Lab Results  Component Value Date   HGBA1C 5.7 (H) 12/03/2021   Lab Results  Component Value Date   INSULIN 50.5 (H) 12/03/2021   Lab Results  Component Value Date   TSH 3.590 12/03/2021   Lab Results  Component Value Date   CHOL 178 12/03/2021   HDL 44 12/03/2021   LDLCALC 103 (H) 12/03/2021   TRIG 176 (H) 12/03/2021   Lab Results  Component Value Date   WBC 6.6 12/03/2021   HGB 13.3 12/03/2021   HCT 40.3 12/03/2021   MCV 88 12/03/2021   PLT 189 12/03/2021    No results found for: IRON, TIBC, FERRITIN  Attestation Statements:   Reviewed by clinician on day of visit: allergies, medications, problem list, medical history, surgical history, family history, social history, and previous encounter notes.  I, Lizbeth Bark, RMA, am acting as Location manager for Coralie Common, MD.   This is the patient's first visit at [facility]. The patient's NEW PATIENT PACKET was reviewed at length. Included in the packet: current and past health history, medications, allergies, ROS, gynecologic history (women only), surgical history, family history, social history, weight history, weight loss surgery history (for those that have had weight loss surgery), nutritional evaluation, mood and food questionnaire, PHQ9, Epworth questionnaire, sleep habits questionnaire, patient life and health improvement goals questionnaire. These will all be scanned into the patient's chart under  media.   During the visit, I independently reviewed the patient's EKG, bioimpedance scale results, and indirect calorimeter results. I used this information to tailor a meal plan for the patient that will help her to lose weight and will improve her obesity-related conditions going forward. I performed a medically necessary appropriate examination and/or evaluation. I discussed the assessment and treatment plan with the patient. The patient was provided an opportunity to ask questions and all were answered. The patient agreed with the plan and demonstrated an understanding of the instructions. Labs were ordered at this visit and will be reviewed at the next visit unless more critical results need to be addressed immediately. Clinical information was updated and documented in the EMR.   Time spent on visit including pre-visit chart review and post-visit care was 45 minutes.   A separate 15 minutes was spent on risk counseling (see above).  I have reviewed the above documentation for  accuracy and completeness, and I agree with the above. - Coralie Common, MD

## 2021-12-20 NOTE — Progress Notes (Deleted)
    Tracy Chang D.[facility] [address] [place] [address] Phone: [phone]   Assessment and Plan:     There are no diagnoses linked to this encounter.  ***   Pertinent previous records reviewed include ***   Follow Up: ***     Subjective:    Subjective:   I, Tracy Chang, am serving as a Neurosurgeon for Doctor Richardean Sale   Chief Complaint: sciatica pain    HPI:    12/03/2021 Patient is a 36 year old female complaining of sciatic pain. Patient states that for a week and a half she has had left side sciatic pain yesterday it started moving to the right , she thinks it could be from the epidural she had when she had her baby, walking hurts a lot , pain radiating down left leg down to the ankle, has been taking tylenol ib , heating pad, ice , diclofenac, and flexeril and nothing is working  12/23/2021 Patient states    Relevant Historical Information: Elevated BMI  Additional pertinent review of systems negative.   Current Outpatient Medications:    albuterol (VENTOLIN HFA) 108 (90 Base) MCG/ACT inhaler, Inhale 1-2 puffs into the lungs every 6 (six) hours as needed for wheezing or shortness of breath., Disp: , Rfl:    ALPRAZolam (XANAX) 0.5 MG tablet, Take 0.5 mg by mouth at bedtime as needed for anxiety., Disp: , Rfl:    cetirizine (ZYRTEC) 10 MG tablet, Take 10 mg by mouth daily., Disp: , Rfl:    cyclobenzaprine (FLEXERIL) 10 MG tablet, Take 1 tablet (10 mg total) by mouth 3 (three) times daily as needed for muscle spasms., Disp: 30 tablet, Rfl: 0   hydrochlorothiazide (HYDRODIURIL) 25 MG tablet, Take 1 tablet (25 mg total) by mouth daily for 5 days., Disp: 5 tablet, Rfl: 0   ibuprofen (ADVIL) 800 MG tablet, Take 1 tablet (800 mg total) by mouth every 6 (six) hours., Disp: 30 tablet, Rfl: 0   levonorgestrel (MIRENA) 20 MCG/DAY IUD, 1 each by Intrauterine route once., Disp: , Rfl:    meloxicam (MOBIC) 15 MG tablet, Take 1  tablet (15 mg total) by mouth daily., Disp: 30 tablet, Rfl: 0   Prenatal Vit-Fe Fumarate-FA (PRENATAL MULTIVITAMIN) TABS tablet, Take 1 tablet by mouth daily at 12 noon., Disp: , Rfl:    sertraline (ZOLOFT) 100 MG tablet, Take 1 tablet (100 mg total) by mouth daily. (Patient taking differently: Take 150 mg by mouth daily.), Disp: 60 tablet, Rfl: 0   valACYclovir (VALTREX) 1000 MG tablet, Take 1,000 mg by mouth 2 (two) times daily., Disp: , Rfl:    Objective:     There were no vitals filed for this visit.    There is no height or weight on file to calculate BMI.    Physical Exam:    ***   Electronically signed by:  Tracy Chang D.[facility] 9:58 AM 12/20/21

## 2021-12-23 ENCOUNTER — Ambulatory Visit: Payer: 59 | Admitting: Sports Medicine

## 2021-12-23 ENCOUNTER — Encounter (INDEPENDENT_AMBULATORY_CARE_PROVIDER_SITE_OTHER): Payer: Self-pay | Admitting: Family Medicine

## 2021-12-23 ENCOUNTER — Encounter (INDEPENDENT_AMBULATORY_CARE_PROVIDER_SITE_OTHER): Payer: Self-pay

## 2021-12-23 ENCOUNTER — Ambulatory Visit (INDEPENDENT_AMBULATORY_CARE_PROVIDER_SITE_OTHER): Payer: 59 | Admitting: Family Medicine

## 2021-12-23 ENCOUNTER — Telehealth (INDEPENDENT_AMBULATORY_CARE_PROVIDER_SITE_OTHER): Payer: Self-pay | Admitting: Family Medicine

## 2021-12-23 VITALS — BP 116/74 | HR 68 | Temp 97.7°F | Ht 62.0 in | Wt 310.0 lb

## 2021-12-23 DIAGNOSIS — R7303 Prediabetes: Secondary | ICD-10-CM | POA: Diagnosis not present

## 2021-12-23 DIAGNOSIS — E559 Vitamin D deficiency, unspecified: Secondary | ICD-10-CM

## 2021-12-23 DIAGNOSIS — Z9189 Other specified personal risk factors, not elsewhere classified: Secondary | ICD-10-CM

## 2021-12-23 DIAGNOSIS — E669 Obesity, unspecified: Secondary | ICD-10-CM

## 2021-12-23 DIAGNOSIS — E7849 Other hyperlipidemia: Secondary | ICD-10-CM

## 2021-12-23 DIAGNOSIS — Z6841 Body Mass Index (BMI) 40.0 and over, adult: Secondary | ICD-10-CM

## 2021-12-23 MED ORDER — VITAMIN D (ERGOCALCIFEROL) 1.25 MG (50000 UNIT) PO CAPS: 50000.0000 [IU] | ORAL_CAPSULE | ORAL | 0 refills | Status: DC

## 2021-12-23 MED ORDER — WEGOVY 0.25 MG/0.5ML ~~LOC~~ SOAJ: 0.2500 mg | SUBCUTANEOUS | 0 refills | Status: DC

## 2021-12-23 NOTE — Telephone Encounter (Signed)
Dr. Jearld Shines - Prior authorization approved for [id number]. Effective:12/23/2021 to 07/25/2022. Patient sent approval message via mychart.  ?

## 2021-12-24 ENCOUNTER — Other Ambulatory Visit (INDEPENDENT_AMBULATORY_CARE_PROVIDER_SITE_OTHER): Payer: Self-pay

## 2021-12-24 ENCOUNTER — Encounter: Payer: Self-pay | Admitting: *Deleted

## 2021-12-24 MED ORDER — WEGOVY 0.25 MG/0.5ML ~~LOC~~ SOAJ: 0.2500 mg | SUBCUTANEOUS | 0 refills | Status: DC

## 2021-12-24 NOTE — Progress Notes (Signed)
Chief Complaint:   OBESITY Tracy Chang is here to discuss her progress with her obesity treatment plan along with follow-up of her obesity related diagnoses. Tracy Chang is on the Category 4 Plan plus 200 calories and states she is following her eating plan approximately 90-95% of the time. Tracy Chang states she is counting 8,000-12,000 steps daily 6  times per week.  Today's visit was #: 2 Starting weight: 319 lbs Starting date: 12/03/2021 Today's weight: 310 lbs Today's date: 12/23/2021 Total lbs lost to date: 9 lbs Total lbs lost since last in-office visit: 9 lbs  Interim History: Tracy Chang feels somewhat disappointed with weight loss. She voices she did do quite a bit of activity at the beach. She felt a few cravings/hunger pains last week. She wanted to lose 20 lbs. She is interested in [place]. She is interested in baked potato.   Subjective:   1. Prediabetes Tracy Chang's A1C was 5.7. Her insulin was 50.5. She notes some cravings for carbohydrates since starting appointment.   2. Vitamin D deficiency Tracy Chang is not on Vitamin D currently. She notes fatigue.   3. Other hyperlipidemia Tracy Chang's LDL was 103. Her triglycerides was 176 and HDL was 44. Her dietary likely increased of trans fats.   4. At risk of diabetes mellitus Tracy Chang is at higher than average risk for developing diabetes due to her obesity.   Assessment/Plan:   1. Prediabetes Tracy Chang agrees to start GLP-1. She will do labs in 3 months. She will continue to work on weight loss, exercise, and decreasing simple carbohydrates to help decrease the risk of diabetes.   2. Vitamin D deficiency Low Vitamin D level contributes to fatigue and are associated with obesity, breast, and colon cancer. Tracy Chang agrees to start prescription Vitamin D 50,000 IU every week for 1 month with no refills and she will follow-up for routine testing of Vitamin D, at least 2-3 times per year to avoid over-replacement.  - Vitamin D, Ergocalciferol, (DRISDOL) 1.25 MG  (50000 UNIT) CAPS capsule; Take 1 capsule (50,000 Units total) by mouth every 7 (seven) days.  Dispense: 4 capsule; Refill: 0  3. Other hyperlipidemia Cardiovascular risk and specific lipid/LDL goals reviewed.  We will repeat labs in 3-4 months. She will continue with Category 4 plan. We discussed several lifestyle modifications today and Tracy Chang will continue to work on diet, exercise and weight loss efforts. Orders and follow up as documented in patient record.   Counseling Intensive lifestyle modifications are the first line treatment for this issue. Dietary changes: Increase soluble fiber. Decrease simple carbohydrates. Exercise changes: Moderate to vigorous-intensity aerobic activity 150 minutes per week if tolerated. Lipid-lowering medications: see documented in medical record.  4. At risk of diabetes mellitus Tracy Chang was given approximately 15 minutes of diabetic education and counseling today. We discussed intensive lifestyle modifications today with an emphasis on weight loss as well as increasing exercise and decreasing simple carbohydrates in her diet. We also reviewed medication options with an emphasis on risk versus benefits of those discussed.  Repetitive spaced learning was employed today to elicit superior memory formation and behavioral change.   5. Obesity with current BMI of 56.8 Tracy Chang is currently in the action stage of change. As such, her goal is to continue with weight loss efforts. She has agreed to the Category 4 Plan plus 200 calories.   Exercise goals:  As is.  Behavioral modification strategies: increasing lean protein intake, meal planning and cooking strategies, keeping healthy foods in the home, and planning for  success.  Tracy Chang has agreed to follow-up with our clinic in 2-3 weeks. She was informed of the importance of frequent follow-up visits to maximize her success with intensive lifestyle modifications for her multiple health conditions.   Objective:   Blood  pressure 116/74, pulse 68, temperature 97.7 F (36.5 C), height 5\' 2"  (1.575 m), weight (!) 310 lb (140.6 kg), SpO2 97 %, currently breastfeeding. Body mass index is 56.7 kg/m.  General: Cooperative, alert, well developed, in no acute distress. HEENT: Conjunctivae and lids unremarkable. Cardiovascular: Regular rhythm.  Lungs: Normal work of breathing. Neurologic: No focal deficits.   Lab Results  Component Value Date   CREATININE 0.64 12/03/2021   BUN 11 12/03/2021   NA 139 12/03/2021   K 4.6 12/03/2021   CL 102 12/03/2021   CO2 24 12/03/2021   Lab Results  Component Value Date   ALT 22 12/03/2021   AST 18 12/03/2021   ALKPHOS 57 12/03/2021   BILITOT <0.2 12/03/2021   Lab Results  Component Value Date   HGBA1C 5.7 (H) 12/03/2021   Lab Results  Component Value Date   INSULIN 50.5 (H) 12/03/2021   Lab Results  Component Value Date   TSH 3.590 12/03/2021   Lab Results  Component Value Date   CHOL 178 12/03/2021   HDL 44 12/03/2021   LDLCALC 103 (H) 12/03/2021   TRIG 176 (H) 12/03/2021   Lab Results  Component Value Date   VD25OH 22.3 (L) 12/03/2021   Lab Results  Component Value Date   WBC 6.6 12/03/2021   HGB 13.3 12/03/2021   HCT 40.3 12/03/2021   MCV 88 12/03/2021   PLT 189 12/03/2021   No results found for: IRON, TIBC, FERRITIN  Attestation Statements:   Reviewed by clinician on day of visit: allergies, medications, problem list, medical history, surgical history, family history, social history, and previous encounter notes.  I, Lizbeth Bark, RMA, am acting as transcriptionist for Coralie Common, MD.  I have reviewed the above documentation for accuracy and completeness, and I agree with the above. - Coralie Common, MD

## 2022-01-09 ENCOUNTER — Encounter (INDEPENDENT_AMBULATORY_CARE_PROVIDER_SITE_OTHER): Payer: Self-pay | Admitting: Family Medicine

## 2022-01-09 ENCOUNTER — Ambulatory Visit (INDEPENDENT_AMBULATORY_CARE_PROVIDER_SITE_OTHER): Payer: 59 | Admitting: Family Medicine

## 2022-01-09 VITALS — BP 101/69 | HR 73 | Temp 97.8°F | Ht 62.0 in | Wt 306.0 lb

## 2022-01-09 DIAGNOSIS — E669 Obesity, unspecified: Secondary | ICD-10-CM

## 2022-01-09 DIAGNOSIS — E559 Vitamin D deficiency, unspecified: Secondary | ICD-10-CM | POA: Diagnosis not present

## 2022-01-09 DIAGNOSIS — Z6841 Body Mass Index (BMI) 40.0 and over, adult: Secondary | ICD-10-CM

## 2022-01-09 DIAGNOSIS — R7303 Prediabetes: Secondary | ICD-10-CM

## 2022-01-09 MED ORDER — METFORMIN HCL 500 MG PO TABS: 500.0000 mg | ORAL_TABLET | Freq: Every day | ORAL | 0 refills | Status: DC

## 2022-01-14 ENCOUNTER — Other Ambulatory Visit (HOSPITAL_BASED_OUTPATIENT_CLINIC_OR_DEPARTMENT_OTHER): Payer: Self-pay

## 2022-01-14 NOTE — Progress Notes (Signed)
Chief Complaint:   OBESITY Tracy Chang is here to discuss her progress with her obesity treatment plan along with follow-up of her obesity related diagnoses. [name] is on the Category 4 Plan+ 200 and states she is following her eating plan approximately 90-95% of the time. [name] states she is walking.  Today's visit was #: 3 Starting weight: 319 lbs Starting date: 12/03/2021 Today's weight: 306 lbs Today's date: 01/09/2022 Total lbs lost to date: 13 lbs Total lbs lost since last in-office visit: 4  Interim History: Tracy Chang voices Tracy Chang is covered by insurance but has a increased speciality drug deductible. She is interested in possible other weight loss medication options. She is interested in calories for the day  and wondering about protein allotment for the day. When working she does like snacking. She may be interested in starting beach body.  Subjective:   1. Prediabetes Tracy Chang's last A1c at 5.7, insuilin at 50.5. She does like carbs but not excessively so.  2. Vitamin D deficiency Tracy Chang forgot to start her Vit D . She notes fatigue.  Assessment/Plan:   1. Prediabetes START Metformin 500 mg by mouth daily. Fill for 1 month with 0 refills.  -START metFORMIN (GLUCOPHAGE) 500 MG tablet; Take 1 tablet (500 mg total) by mouth daily with breakfast.  Dispense: 30 tablet; Refill: 0  2. Vitamin D deficiency Tracy Chang is to stat Vit D supplementation.  3. Obesity with current BMI of 56.0 Tracy Chang is currently in the action stage of change. As such, her goal is to continue with weight loss efforts. She has agreed to the Category 4 Plan + 200 and keeping a journal of 2000-2200 calories and 150+ grams of protein daily.  Exercise goals: All adults should avoid inactivity. Some physical activity is better than none, and adults who participate in any amount of physical activity gain some health benefits.  Behavioral modification strategies: increasing lean protein intake, meal planning and cooking  strategies, keeping healthy foods in the home, and planning for success.  Tracy Chang has agreed to follow-up with our clinic in 3 weeks. She was informed of the importance of frequent follow-up visits to maximize her success with intensive lifestyle modifications for her multiple health conditions.   Objective:   Blood pressure 101/69, pulse 73, temperature 97.8 F (36.6 C), height 5\' 2"  (1.575 m), weight (!) 306 lb (138.8 kg), SpO2 95 %, currently breastfeeding. Body mass index is 55.97 kg/m.  General: Cooperative, alert, well developed, in no acute distress. HEENT: Conjunctivae and lids unremarkable. Cardiovascular: Regular rhythm.  Lungs: Normal work of breathing. Neurologic: No focal deficits.   Lab Results  Component Value Date   CREATININE 0.64 12/03/2021   BUN 11 12/03/2021   NA 139 12/03/2021   K 4.6 12/03/2021   CL 102 12/03/2021   CO2 24 12/03/2021   Lab Results  Component Value Date   ALT 22 12/03/2021   AST 18 12/03/2021   ALKPHOS 57 12/03/2021   BILITOT <0.2 12/03/2021   Lab Results  Component Value Date   HGBA1C 5.7 (H) 12/03/2021   Lab Results  Component Value Date   INSULIN 50.5 (H) 12/03/2021   Lab Results  Component Value Date   TSH 3.590 12/03/2021   Lab Results  Component Value Date   CHOL 178 12/03/2021   HDL 44 12/03/2021   LDLCALC 103 (H) 12/03/2021   TRIG 176 (H) 12/03/2021   Lab Results  Component Value Date   VD25OH 22.3 (L) 12/03/2021   Lab Results  Component Value Date   WBC 6.6 12/03/2021   HGB 13.3 12/03/2021   HCT 40.3 12/03/2021   MCV 88 12/03/2021   PLT 189 12/03/2021   No results found for: IRON, TIBC, FERRITIN  Attestation Statements:   Reviewed by clinician on day of visit: allergies, medications, problem list, medical history, surgical history, family history, social history, and previous encounter notes.  I, Fortino Sic, RMA am acting as transcriptionist for Reuben Likes, MD.  I have reviewed the above  documentation for accuracy and completeness, and I agree with the above. - Reuben Likes, MD

## 2022-01-27 ENCOUNTER — Ambulatory Visit (INDEPENDENT_AMBULATORY_CARE_PROVIDER_SITE_OTHER): Payer: 59 | Admitting: Family Medicine

## 2022-01-30 ENCOUNTER — Ambulatory Visit (INDEPENDENT_AMBULATORY_CARE_PROVIDER_SITE_OTHER): Payer: 59 | Admitting: Family Medicine

## 2022-01-30 ENCOUNTER — Encounter (INDEPENDENT_AMBULATORY_CARE_PROVIDER_SITE_OTHER): Payer: Self-pay | Admitting: Family Medicine

## 2022-01-30 NOTE — Telephone Encounter (Signed)
Patient's appt has been cancelled

## 2022-02-09 ENCOUNTER — Other Ambulatory Visit (INDEPENDENT_AMBULATORY_CARE_PROVIDER_SITE_OTHER): Payer: Self-pay | Admitting: Family Medicine

## 2022-02-09 DIAGNOSIS — R7303 Prediabetes: Secondary | ICD-10-CM

## 2022-02-13 ENCOUNTER — Ambulatory Visit (INDEPENDENT_AMBULATORY_CARE_PROVIDER_SITE_OTHER): Payer: 59 | Admitting: Family Medicine

## 2022-02-13 ENCOUNTER — Encounter (INDEPENDENT_AMBULATORY_CARE_PROVIDER_SITE_OTHER): Payer: Self-pay | Admitting: Family Medicine

## 2022-02-13 VITALS — BP 123/78 | HR 65 | Temp 98.0°F | Ht 62.0 in | Wt 303.0 lb

## 2022-02-13 DIAGNOSIS — R7303 Prediabetes: Secondary | ICD-10-CM | POA: Diagnosis not present

## 2022-02-13 DIAGNOSIS — E559 Vitamin D deficiency, unspecified: Secondary | ICD-10-CM | POA: Diagnosis not present

## 2022-02-13 DIAGNOSIS — E669 Obesity, unspecified: Secondary | ICD-10-CM | POA: Diagnosis not present

## 2022-02-13 DIAGNOSIS — Z6841 Body Mass Index (BMI) 40.0 and over, adult: Secondary | ICD-10-CM | POA: Diagnosis not present

## 2022-02-13 MED ORDER — METFORMIN HCL 500 MG PO TABS
500.0000 mg | ORAL_TABLET | Freq: Every day | ORAL | 0 refills | Status: DC
Start: 2022-02-13 — End: 2022-05-13

## 2022-02-13 MED ORDER — VITAMIN D (ERGOCALCIFEROL) 1.25 MG (50000 UNIT) PO CAPS: 50000.0000 [IU] | ORAL_CAPSULE | ORAL | 0 refills | Status: DC

## 2022-02-17 NOTE — Progress Notes (Signed)
Chief Complaint:   OBESITY Tracy Chang is here to discuss her progress with her obesity treatment plan along with follow-up of her obesity related diagnoses. Tracy Chang is on the Category 4 Plan and keeping a food journal and adhering to recommended goals of 2000-2200 calories and 150+ grams of protein and states she is following her eating plan approximately 0% of the time. Tracy Chang states she is exercising  0 minutes 0 times per week.  Today's visit was #: 4 Starting weight: 319 lbs Starting date: 12/03/2021 Today's weight: 303 lbs Today's date: 02/13/2022 Total lbs lost to date: 16 lbs Total lbs lost since last in-office visit: 3  Interim History: Tracy Chang voices her cousin recently died from a overdose. Yesterday she had to call out of work due to grief. She has done a significant amount of emotional eating and has not logged. She has gotten lots of food from friends and family.  Subjective:   1. Vitamin D deficiency Tracy Chang is currently taking prescription Vit D 50,000 IU once a week. Denies any nausea, vomiting or muscle weakness. She notes fatigue. Her last Vit D level of 22.3.  2. Prediabetes Tracy Chang is currently on Metformin. Denies GI side effects noted with increase emotional eating.  Assessment/Plan:   1. Vitamin D deficiency We will refill Vit D 50,000 IU once a week for 1 month with 0 refills.  -Refill Vitamin D, Ergocalciferol, (DRISDOL) 1.25 MG (50000 UNIT) CAPS capsule; Take 1 capsule (50,000 Units total) by mouth every 7 (seven) days.  Dispense: 4 capsule; Refill: 0  2. Prediabetes We will refill Metformin 500 mg by mouth daily for 1 month with 0 refills.  -Refill metFORMIN (GLUCOPHAGE) 500 MG tablet; Take 1 tablet (500 mg total) by mouth daily with breakfast.  Dispense: 30 tablet; Refill: 0  3. Obesity with current BMI of 55.5 Tracy Chang is currently in the action stage of change. As such, her goal is to continue with weight loss efforts. She has agreed to the Category 4 Plan and  keeping a food journal and adhering to recommended goals of 2000-2200 calories and 150+ grams of protein daily.  Exercise goals: No exercise has been prescribed at this time.  Behavioral modification strategies: increasing lean protein intake, meal planning and cooking strategies, keeping healthy foods in the home, planning for success, and keeping a strict food journal.  Tracy Chang has agreed to follow-up with our clinic in 3 weeks. She was informed of the importance of frequent follow-up visits to maximize her success with intensive lifestyle modifications for her multiple health conditions.   Objective:   Blood pressure 123/78, pulse 65, temperature 98 F (36.7 C), height 5\' 2"  (1.575 m), weight (!) 303 lb (137.4 kg), SpO2 97 %, currently breastfeeding. Body mass index is 55.42 kg/m.  General: Cooperative, alert, well developed, in no acute distress. HEENT: Conjunctivae and lids unremarkable. Cardiovascular: Regular rhythm.  Lungs: Normal work of breathing. Neurologic: No focal deficits.   Lab Results  Component Value Date   CREATININE 0.64 12/03/2021   BUN 11 12/03/2021   NA 139 12/03/2021   K 4.6 12/03/2021   CL 102 12/03/2021   CO2 24 12/03/2021   Lab Results  Component Value Date   ALT 22 12/03/2021   AST 18 12/03/2021   ALKPHOS 57 12/03/2021   BILITOT <0.2 12/03/2021   Lab Results  Component Value Date   HGBA1C 5.7 (H) 12/03/2021   Lab Results  Component Value Date   INSULIN 50.5 (H) 12/03/2021  Lab Results  Component Value Date   TSH 3.590 12/03/2021   Lab Results  Component Value Date   CHOL 178 12/03/2021   HDL 44 12/03/2021   LDLCALC 103 (H) 12/03/2021   TRIG 176 (H) 12/03/2021   Lab Results  Component Value Date   VD25OH 22.3 (L) 12/03/2021   Lab Results  Component Value Date   WBC 6.6 12/03/2021   HGB 13.3 12/03/2021   HCT 40.3 12/03/2021   MCV 88 12/03/2021   PLT 189 12/03/2021   No results found for: "IRON", "TIBC",  "FERRITIN"  Attestation Statements:   Reviewed by clinician on day of visit: allergies, medications, problem list, medical history, surgical history, family history, social history, and previous encounter notes.  I, Fortino Sic, RMA am acting as transcriptionist for Reuben Likes, MD.  I have reviewed the above documentation for accuracy and completeness, and I agree with the above. - Reuben Likes, MD

## 2022-03-10 ENCOUNTER — Other Ambulatory Visit (INDEPENDENT_AMBULATORY_CARE_PROVIDER_SITE_OTHER): Payer: Self-pay | Admitting: Family Medicine

## 2022-03-10 DIAGNOSIS — R7303 Prediabetes: Secondary | ICD-10-CM

## 2022-03-15 ENCOUNTER — Other Ambulatory Visit (INDEPENDENT_AMBULATORY_CARE_PROVIDER_SITE_OTHER): Payer: Self-pay | Admitting: Family Medicine

## 2022-03-15 DIAGNOSIS — R7303 Prediabetes: Secondary | ICD-10-CM

## 2022-03-19 ENCOUNTER — Encounter (INDEPENDENT_AMBULATORY_CARE_PROVIDER_SITE_OTHER): Payer: Self-pay

## 2022-04-10 ENCOUNTER — Ambulatory Visit (INDEPENDENT_AMBULATORY_CARE_PROVIDER_SITE_OTHER): Payer: 59 | Admitting: Family Medicine

## 2022-04-24 LAB — HM PAP SMEAR: HPV, high-risk: NEGATIVE

## 2022-05-01 ENCOUNTER — Encounter (INDEPENDENT_AMBULATORY_CARE_PROVIDER_SITE_OTHER): Payer: Self-pay | Admitting: Family Medicine

## 2022-05-01 ENCOUNTER — Ambulatory Visit (INDEPENDENT_AMBULATORY_CARE_PROVIDER_SITE_OTHER): Payer: 59 | Admitting: Family Medicine

## 2022-05-01 VITALS — BP 106/69 | HR 89 | Temp 97.7°F | Ht 62.0 in | Wt 310.0 lb

## 2022-05-01 DIAGNOSIS — E559 Vitamin D deficiency, unspecified: Secondary | ICD-10-CM | POA: Diagnosis not present

## 2022-05-01 DIAGNOSIS — R7303 Prediabetes: Secondary | ICD-10-CM

## 2022-05-01 DIAGNOSIS — E669 Obesity, unspecified: Secondary | ICD-10-CM | POA: Diagnosis not present

## 2022-05-01 DIAGNOSIS — Z6841 Body Mass Index (BMI) 40.0 and over, adult: Secondary | ICD-10-CM | POA: Diagnosis not present

## 2022-05-06 NOTE — Progress Notes (Signed)
Chief Complaint:   OBESITY Tracy Chang is here to discuss her progress with her obesity treatment plan along with follow-up of her obesity related diagnoses. Tracy Chang is on the Category 4 Plan and keeping a food journal and adhering to recommended goals of 2000-2200 calories and 150+ grams protein and states she is following her eating plan approximately 0% of the time. Tracy Chang states she is exercising 0 minutes 0 times per week.  Today's visit was #: 5 Starting weight: 319 lbs Starting date: 12/03/2021 Today's weight: 310 lbs Today's date: 05/01/2022 Total lbs lost to date: 9 lbs Total lbs lost since last in-office visit: 0  Interim History: Tracy Chang has been very stressed with death in her family. Still grocery shopping on plan but not following plan. She is very stretched thin at work. Wants to incorporate potatoes especially with salmon. Husband is present bedside.   Subjective:   1. Prediabetes Tracy Chang is on Metformin 500 mg daily. Last A1c 5.7, insulin 50.5.  2. Vitamin D deficiency Tracy Chang is currently taking prescription Vit D 50,000 IU once a week. Denies any nausea, vomiting or muscle weakness. HShe notes fatigue.  Assessment/Plan:   1. Prediabetes Continue Metformin daily.  2. Vitamin D deficiency Continue prescription Vit D.  3. Obesity with current BMI of 56.7 Tracy Chang is currently in the action stage of change. As such, her goal is to continue with weight loss efforts. She has agreed to keeping a food journal and adhering to recommended goals of 2000-2200 calories and 150+ grams of protein daily.   Exercise goals: All adults should avoid inactivity. Some physical activity is better than none, and adults who participate in any amount of physical activity gain some health benefits.  Behavioral modification strategies: increasing lean protein intake, meal planning and cooking strategies, keeping healthy foods in the home, and planning for success.  Tracy Chang has agreed to follow-up with  our clinic in 2 weeks. She was informed of the importance of frequent follow-up visits to maximize her success with intensive lifestyle modifications for her multiple health conditions.   Objective:   Blood pressure 106/69, pulse 89, temperature 97.7 F (36.5 C), height 5\' 2"  (1.575 m), weight (!) 310 lb (140.6 kg), SpO2 95 %, currently breastfeeding. Body mass index is 56.7 kg/m.  General: Cooperative, alert, well developed, in no acute distress. HEENT: Conjunctivae and lids unremarkable. Cardiovascular: Regular rhythm.  Lungs: Normal work of breathing. Neurologic: No focal deficits.   Lab Results  Component Value Date   CREATININE 0.64 12/03/2021   BUN 11 12/03/2021   NA 139 12/03/2021   K 4.6 12/03/2021   CL 102 12/03/2021   CO2 24 12/03/2021   Lab Results  Component Value Date   ALT 22 12/03/2021   AST 18 12/03/2021   ALKPHOS 57 12/03/2021   BILITOT <0.2 12/03/2021   Lab Results  Component Value Date   HGBA1C 5.7 (H) 12/03/2021   Lab Results  Component Value Date   INSULIN 50.5 (H) 12/03/2021   Lab Results  Component Value Date   TSH 3.590 12/03/2021   Lab Results  Component Value Date   CHOL 178 12/03/2021   HDL 44 12/03/2021   LDLCALC 103 (H) 12/03/2021   TRIG 176 (H) 12/03/2021   Lab Results  Component Value Date   VD25OH 22.3 (L) 12/03/2021   Lab Results  Component Value Date   WBC 6.6 12/03/2021   HGB 13.3 12/03/2021   HCT 40.3 12/03/2021   MCV 88 12/03/2021  PLT 189 12/03/2021   No results found for: "IRON", "TIBC", "FERRITIN"  Attestation Statements:   Reviewed by clinician on day of visit: allergies, medications, problem list, medical history, surgical history, family history, social history, and previous encounter notes.  Time spent on visit including pre-visit chart review and post-visit care and charting was 25 minutes.   I, Elnora Morrison, RMA am acting as transcriptionist for Coralie Common, MD.  I have reviewed the above  documentation for accuracy and completeness, and I agree with the above. - Coralie Common, MD

## 2022-05-13 ENCOUNTER — Ambulatory Visit (INDEPENDENT_AMBULATORY_CARE_PROVIDER_SITE_OTHER): Payer: 59 | Admitting: Family Medicine

## 2022-05-13 ENCOUNTER — Encounter (INDEPENDENT_AMBULATORY_CARE_PROVIDER_SITE_OTHER): Payer: Self-pay | Admitting: Family Medicine

## 2022-05-13 VITALS — BP 110/73 | HR 78 | Temp 98.4°F | Ht 62.0 in | Wt 307.0 lb

## 2022-05-13 DIAGNOSIS — E669 Obesity, unspecified: Secondary | ICD-10-CM

## 2022-05-13 DIAGNOSIS — E7849 Other hyperlipidemia: Secondary | ICD-10-CM | POA: Diagnosis not present

## 2022-05-13 DIAGNOSIS — R7303 Prediabetes: Secondary | ICD-10-CM | POA: Diagnosis not present

## 2022-05-13 DIAGNOSIS — E559 Vitamin D deficiency, unspecified: Secondary | ICD-10-CM | POA: Diagnosis not present

## 2022-05-13 DIAGNOSIS — Z6841 Body Mass Index (BMI) 40.0 and over, adult: Secondary | ICD-10-CM

## 2022-05-13 MED ORDER — METFORMIN HCL ER 500 MG PO TB24: 500.0000 mg | ORAL_TABLET | Freq: Every day | ORAL | 0 refills | Status: AC

## 2022-05-13 NOTE — Progress Notes (Signed)
Chief Complaint:   OBESITY Tracy Chang is here to discuss her progress with her obesity treatment plan along with follow-up of her obesity related diagnoses. Tracy Chang is on keeping a food journal and adhering to recommended goals of 2000-2200 calories and 150+ grams of protein and states she is following her eating plan approximately 80% of the time. Tracy Chang states she is walking 30 minutes 3-4 times per week.  Today's visit was #: 6 Starting weight: 319 lbs Starting date: 12/03/2021 Today's weight: 307 lbs Today's date: 05/13/2022 Total lbs lost to date: 12 lbs Total lbs lost since last in-office visit: 3  Interim History: [name]'s father in law just had a significant and critical motorcycle accident down in [place]. Tried to stick to plan as much as she could regardless. Averaging 2000 calories and trying to be attentive to protein intake. Work is going slightly better.   Subjective:   1. Prediabetes Last A1c 5.7, insulin 50.5, on Glucophage but having some diarrhea.  2. Vitamin D deficiency Tracy Chang is currently taking prescription Vit D 50,000 IU once a week. Lats Vit D level 22.3.  3. Other hyperlipidemia Last LDL of 103. Not on medications--minimal elevation.  Assessment/Plan:   1. Prediabetes We will obtain labs today. Change/refill to Metformin XR 500 mg by mouth daily for 1 month with 0 refills.  -Change/refill metFORMIN (GLUCOPHAGE-XR) 500 MG 24 hr tablet; Take 1 tablet (500 mg total) by mouth daily with breakfast.  Dispense: 30 tablet; Refill: 0  - Comprehensive metabolic panel - Hemoglobin A1c - Insulin, random  2. Vitamin D deficiency We will obtain labs today.  - VITAMIN D 25 Hydroxy (Vit-D Deficiency, Fractures)  3. Other hyperlipidemia We will obtain labs today.  - Lipid Panel With LDL/HDL Ratio  4. Obesity with current BMI of 56.2 Tracy Chang is currently in the action stage of change. As such, her goal is to continue with weight loss efforts. She has agreed to  keeping a food journal and adhering to recommended goals of 2000-2200 calories and 150+ grams of protein daily.   Exercise goals: All adults should avoid inactivity. Some physical activity is better than none, and adults who participate in any amount of physical activity gain some health benefits.  Behavioral modification strategies: increasing lean protein intake, meal planning and cooking strategies, keeping healthy foods in the home, planning for success, and keeping a strict food journal.  Tracy Chang has agreed to follow-up with our clinic in 3 weeks. She was informed of the importance of frequent follow-up visits to maximize her success with intensive lifestyle modifications for her multiple health conditions.   Tracy Chang was informed we would discuss her lab results at her next visit unless there is a critical issue that needs to be addressed sooner. Tracy Chang agreed to keep her next visit at the agreed upon time to discuss these results.  Objective:   Blood pressure 110/73, pulse 78, temperature 98.4 F (36.9 C), height 5\' 2"  (1.575 m), weight (!) 307 lb (139.3 kg), SpO2 97 %, currently breastfeeding. Body mass index is 56.15 kg/m.  General: Cooperative, alert, well developed, in no acute distress. HEENT: Conjunctivae and lids unremarkable. Cardiovascular: Regular rhythm.  Lungs: Normal work of breathing. Neurologic: No focal deficits.   Lab Results  Component Value Date   CREATININE 0.64 12/03/2021   BUN 11 12/03/2021   NA 139 12/03/2021   K 4.6 12/03/2021   CL 102 12/03/2021   CO2 24 12/03/2021   Lab Results  Component Value Date  ALT 22 12/03/2021   AST 18 12/03/2021   ALKPHOS 57 12/03/2021   BILITOT <0.2 12/03/2021   Lab Results  Component Value Date   HGBA1C 5.7 (H) 12/03/2021   Lab Results  Component Value Date   INSULIN 50.5 (H) 12/03/2021   Lab Results  Component Value Date   TSH 3.590 12/03/2021   Lab Results  Component Value Date   CHOL 178 12/03/2021   HDL  44 12/03/2021   LDLCALC 103 (H) 12/03/2021   TRIG 176 (H) 12/03/2021   Lab Results  Component Value Date   VD25OH 22.3 (L) 12/03/2021   Lab Results  Component Value Date   WBC 6.6 12/03/2021   HGB 13.3 12/03/2021   HCT 40.3 12/03/2021   MCV 88 12/03/2021   PLT 189 12/03/2021   No results found for: "IRON", "TIBC", "FERRITIN"  Attestation Statements:   Reviewed by clinician on day of visit: allergies, medications, problem list, medical history, surgical history, family history, social history, and previous encounter notes.  I, Fortino Sic, RMA am acting as transcriptionist for Reuben Likes, MD.  I have reviewed the above documentation for accuracy and completeness, and I agree with the above. - Reuben Likes, MD

## 2022-05-14 LAB — VITAMIN D 25 HYDROXY (VIT D DEFICIENCY, FRACTURES): Vit D, 25-Hydroxy: 27.7 ng/mL — ABNORMAL LOW (ref 30.0–100.0)

## 2022-05-14 LAB — COMPREHENSIVE METABOLIC PANEL
ALT: 24 IU/L (ref 0–32)
AST: 17 IU/L (ref 0–40)
Albumin/Globulin Ratio: 2.1 (ref 1.2–2.2)
Albumin: 4.5 g/dL (ref 3.9–4.9)
Alkaline Phosphatase: 53 IU/L (ref 44–121)
BUN/Creatinine Ratio: 17 (ref 9–23)
BUN: 12 mg/dL (ref 6–20)
Bilirubin Total: 0.2 mg/dL (ref 0.0–1.2)
CO2: 23 mmol/L (ref 20–29)
Calcium: 9.1 mg/dL (ref 8.7–10.2)
Chloride: 101 mmol/L (ref 96–106)
Creatinine, Ser: 0.72 mg/dL (ref 0.57–1.00)
Globulin, Total: 2.1 g/dL (ref 1.5–4.5)
Glucose: 100 mg/dL — ABNORMAL HIGH (ref 70–99)
Potassium: 4.3 mmol/L (ref 3.5–5.2)
Sodium: 143 mmol/L (ref 134–144)
Total Protein: 6.6 g/dL (ref 6.0–8.5)
eGFR: 111 mL/min/{1.73_m2} (ref >59)

## 2022-05-14 LAB — LIPID PANEL WITH LDL/HDL RATIO
Cholesterol, Total: 175 mg/dL (ref 100–199)
HDL: 43 mg/dL (ref >39)
LDL Chol Calc (NIH): 99 mg/dL (ref 0–99)
LDL/HDL Ratio: 2.3 ratio (ref 0.0–3.2)
Triglycerides: 193 mg/dL — ABNORMAL HIGH (ref 0–149)
VLDL Cholesterol Cal: 33 mg/dL (ref 5–40)

## 2022-05-14 LAB — HEMOGLOBIN A1C
Est. average glucose Bld gHb Est-mCnc: 117 mg/dL
Hgb A1c MFr Bld: 5.7 % — ABNORMAL HIGH (ref 4.8–5.6)

## 2022-05-14 LAB — INSULIN, RANDOM: INSULIN: 45.3 u[IU]/mL — ABNORMAL HIGH (ref 2.6–24.9)

## 2022-05-28 ENCOUNTER — Ambulatory Visit (INDEPENDENT_AMBULATORY_CARE_PROVIDER_SITE_OTHER): Payer: 59 | Admitting: Family Medicine

## 2022-06-01 MED ORDER — VITAMIN D (ERGOCALCIFEROL) 1.25 MG (50000 UNIT) PO CAPS: 50000.0000 [IU] | ORAL_CAPSULE | ORAL | 0 refills | Status: DC

## 2022-06-23 ENCOUNTER — Ambulatory Visit (INDEPENDENT_AMBULATORY_CARE_PROVIDER_SITE_OTHER): Payer: 59 | Admitting: Sports Medicine

## 2022-06-23 ENCOUNTER — Ambulatory Visit (INDEPENDENT_AMBULATORY_CARE_PROVIDER_SITE_OTHER): Payer: 59

## 2022-06-23 VITALS — BP 122/78 | HR 78 | Ht 62.0 in | Wt 307.0 lb

## 2022-06-23 DIAGNOSIS — M25511 Pain in right shoulder: Secondary | ICD-10-CM | POA: Diagnosis not present

## 2022-06-23 DIAGNOSIS — M7551 Bursitis of right shoulder: Secondary | ICD-10-CM

## 2022-06-23 MED ORDER — CYCLOBENZAPRINE HCL 10 MG PO TABS: 10.0000 mg | ORAL_TABLET | Freq: Every day | ORAL | 0 refills | Status: DC

## 2022-06-23 NOTE — Progress Notes (Signed)
Tracy Chang D.[facility] [address] [place] [address] Phone: [phone]   Assessment and Plan:     1. Acute pain of right shoulder 2. Subacromial bursitis of right shoulder joint  -Acute, uncomplicated, initial sports medicine visit - Most consistent with rotator cuff strain versus subacromial bursitis likely from patient not sleeping on her shoulder or lifting her son - X-ray obtained in clinic.  My interpretation: No acute fracture or dislocation.  Largely unremarkable imaging - Patient elected for subacromial CSI.  Tolerated well per note below - Start HEP for rotator cuff -We will refill Flexeril that patient may use as needed for muscle spasms at night  Procedure: Subacromial Injection Side: Right  Risks explained and consent was given verbally. The site was cleaned with alcohol prep. A steroid injection was performed from posterior approach using 68mL of 1% lidocaine without epinephrine and 19mL of kenalog 40mg /ml. This was well tolerated and resulted in symptomatic relief.  Needle was removed, hemostasis achieved, and post injection instructions were explained.   Pt was advised to call or return to clinic if these symptoms worsen or fail to improve as anticipated.   Pertinent previous records reviewed include none   Follow Up: 3 to 4 weeks for reevaluation.  Could consider ultrasound versus physical therapy if no improvement or worsening of symptoms   Subjective:   I, Tracy Chang, am serving as a for Doctor Neurosurgeon  Chief Complaint: right shoulder pain   HPI:   06/23/22 Patient is a 36 year old female complaining of right shoulder pain. Patient states for the last week , she has felt like neuropathy and tightness,over the weekend in bicep tendon down she has neuropathy down her arm, at night the pain is worst, she has tried TENS, RICES, flexeril , tylenol , ib and stretching nothing takes the edge off,today  the pain has gotten better, at rest pain is 4/10 and when moving 8/10 pain ,    Relevant Historical Information: None pertinent  Additional pertinent review of systems negative.   Current Outpatient Medications:    albuterol (VENTOLIN HFA) 108 (90 Base) MCG/ACT inhaler, Inhale 1-2 puffs into the lungs every 6 (six) hours as needed for wheezing or shortness of breath., Disp: , Rfl:    ALPRAZolam (XANAX) 0.5 MG tablet, Take 0.5 mg by mouth at bedtime as needed for anxiety., Disp: , Rfl:    cetirizine (ZYRTEC) 10 MG tablet, Take 10 mg by mouth daily., Disp: , Rfl:    cyclobenzaprine (FLEXERIL) 10 MG tablet, Take 1 tablet (10 mg total) by mouth 3 (three) times daily as needed for muscle spasms., Disp: 30 tablet, Rfl: 0   metFORMIN (GLUCOPHAGE-XR) 500 MG 24 hr tablet, Take 1 tablet (500 mg total) by mouth daily with breakfast., Disp: 30 tablet, Rfl: 0   Prenatal Vit-Fe Fumarate-FA (PRENATAL MULTIVITAMIN) TABS tablet, Take 1 tablet by mouth daily at 12 noon., Disp: , Rfl:    sertraline (ZOLOFT) 100 MG tablet, Take 1 tablet (100 mg total) by mouth daily. (Patient taking differently: Take 150 mg by mouth daily.), Disp: 60 tablet, Rfl: 0   valACYclovir (VALTREX) 1000 MG tablet, Take 1,000 mg by mouth as needed., Disp: , Rfl:    Vitamin D, Ergocalciferol, (DRISDOL) 1.25 MG (50000 UNIT) CAPS capsule, Take 1 capsule (50,000 Units total) by mouth every 7 (seven) days., Disp: 4 capsule, Rfl: 0   Objective:     Vitals:   06/23/22 1447  BP:  122/78  Pulse: 78  SpO2: 96%  Weight: (!) 307 lb (139.3 kg)  Height: 5\' 2"  (1.575 m)      Body mass index is 56.15 kg/m.    Physical Exam:    Gen: Appears well, nad, nontoxic and pleasant Neuro:sensation intact, strength is 5/5 with df/pf/inv/ev, muscle tone wnl Skin: no suspicious lesion or defmority Psych: A&O, appropriate mood and affect  Right Shoulder: no deformity, swelling or muscle wasting No scapular winging FF 100, abd 100, int 20, ext 70 TTP  biceps groove, humerus, deltoid, trapezius NTTP over the [place], clavicle, ac, coracoid,   cervical spine Nonspecific pan positive special testing Neg ant drawer, sulcus sign, apprehension Negative Spurling's test bilat FROM of neck    Electronically signed by:  D.[facility] 3:14 PM 06/23/22

## 2022-06-23 NOTE — Patient Instructions (Addendum)
Good to see you  Refill flexeril  Shoulder HEP 3-4 week follow up

## 2022-07-16 NOTE — Progress Notes (Deleted)
    Tracy Chang D.[facility] [address] [place] [address] Phone: [phone]   Assessment and Plan:     There are no diagnoses linked to this encounter.  ***   Pertinent previous records reviewed include ***   Follow Up: ***     Subjective:   I, Tracy Chang, am serving as a Neurosurgeon for Doctor Richardean Sale   Chief Complaint: right shoulder pain    HPI:    06/23/22 Patient is a 36 year old female complaining of right shoulder pain. Patient states for the last week , she has felt like neuropathy and tightness,over the weekend in bicep tendon down she has neuropathy down her arm, at night the pain is worst, she has tried TENS, RICES, flexeril , tylenol , ib and stretching nothing takes the edge off,today the pain has gotten better, at rest pain is 4/10 and when moving 8/10 pain ,     07/17/2022 Patient states  Relevant Historical Information: None pertinent  Additional pertinent review of systems negative.   Current Outpatient Medications:    albuterol (VENTOLIN HFA) 108 (90 Base) MCG/ACT inhaler, Inhale 1-2 puffs into the lungs every 6 (six) hours as needed for wheezing or shortness of breath., Disp: , Rfl:    ALPRAZolam (XANAX) 0.5 MG tablet, Take 0.5 mg by mouth at bedtime as needed for anxiety., Disp: , Rfl:    cetirizine (ZYRTEC) 10 MG tablet, Take 10 mg by mouth daily., Disp: , Rfl:    cyclobenzaprine (FLEXERIL) 10 MG tablet, Take 1 tablet (10 mg total) by mouth at bedtime., Disp: 30 tablet, Rfl: 0   metFORMIN (GLUCOPHAGE-XR) 500 MG 24 hr tablet, Take 1 tablet (500 mg total) by mouth daily with breakfast., Disp: 30 tablet, Rfl: 0   Prenatal Vit-Fe Fumarate-FA (PRENATAL MULTIVITAMIN) TABS tablet, Take 1 tablet by mouth daily at 12 noon., Disp: , Rfl:    sertraline (ZOLOFT) 100 MG tablet, Take 1 tablet (100 mg total) by mouth daily. (Patient taking differently: Take 150 mg by mouth daily.), Disp: 60 tablet, Rfl: 0    valACYclovir (VALTREX) 1000 MG tablet, Take 1,000 mg by mouth as needed., Disp: , Rfl:    Vitamin D, Ergocalciferol, (DRISDOL) 1.25 MG (50000 UNIT) CAPS capsule, Take 1 capsule (50,000 Units total) by mouth every 7 (seven) days., Disp: 4 capsule, Rfl: 0   Objective:     There were no vitals filed for this visit.    There is no height or weight on file to calculate BMI.    Physical Exam:    ***   Electronically signed by:  Tracy [facility] 8:32 AM 07/16/22

## 2022-07-17 ENCOUNTER — Ambulatory Visit: Payer: 59 | Admitting: Sports Medicine

## 2022-08-11 IMAGING — CT CT ANGIO CHEST
4 of 7 series · 17 of 46 positions shown · IV contrast (omnipaque)
Comparison: None.

CLINICAL DATA: Chest pain. Shortness of breath. Thirty-four weeks
pregnant.

EXAM:
CT ANGIOGRAPHY CHEST WITH CONTRAST
TECHNIQUE: Multidetector CT imaging of the chest was performed using the
standard protocol during bolus administration of intravenous
contrast. Multiplanar CT image reconstructions and MIPs were
obtained to evaluate the vascular anatomy.
CONTRAST:  100mL OMNIPAQUE IOHEXOL 350 MG/ML SOLN

[Series 7: arterial · axial · arterial · 0.80mm/px · z∈[-78,+54]mm · 4 of 111 slices shown]
[im 23/111  lung]
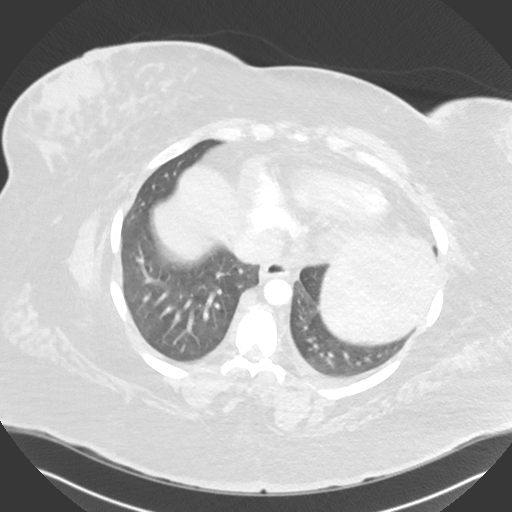
[im 45/111  soft-tissue]
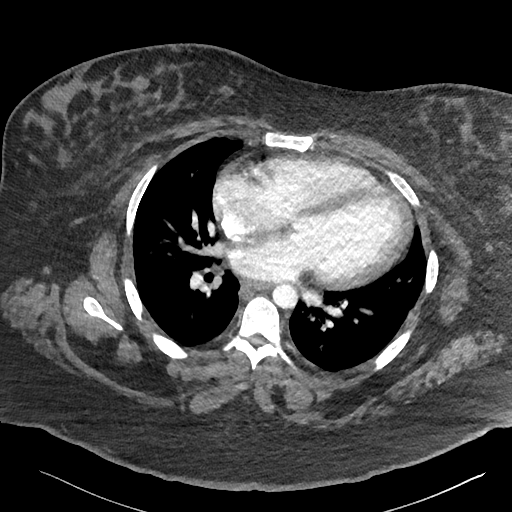
[im 67/111  lung]
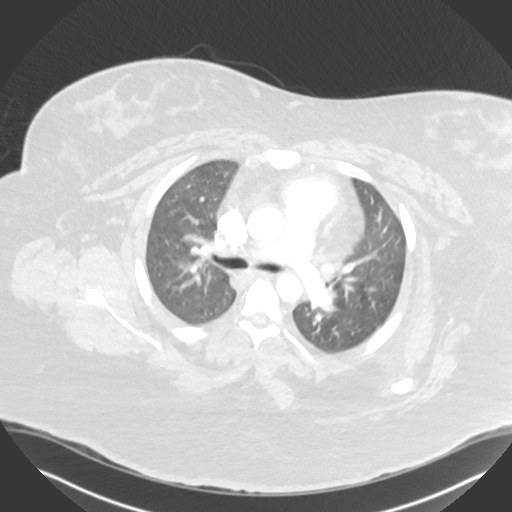
[im 89/111  soft-tissue]
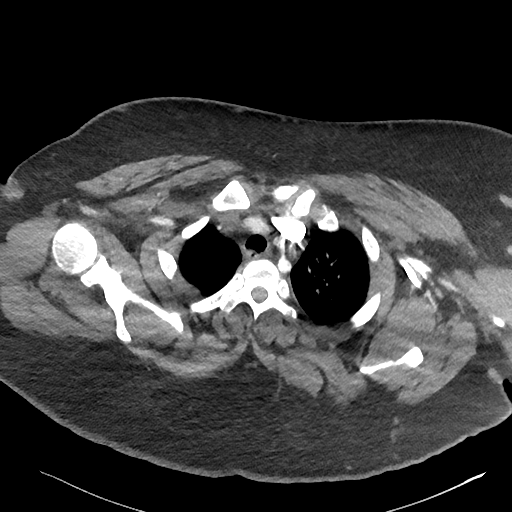

[Series 8: thins · axial · 0.80mm/px · z∈[-98,+73]mm · 8 of 315 slices shown]
[im 35/315  lung]
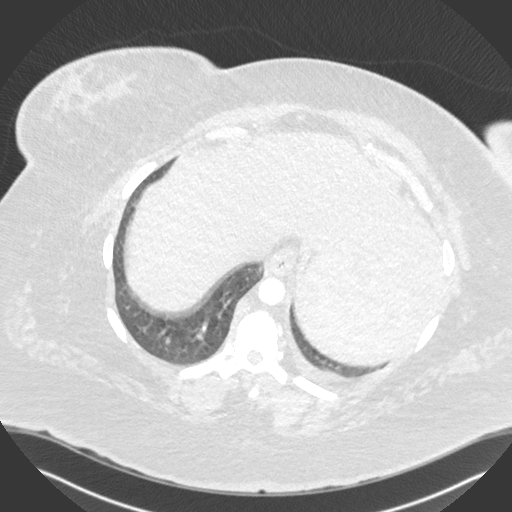
[im 70/315  lung]
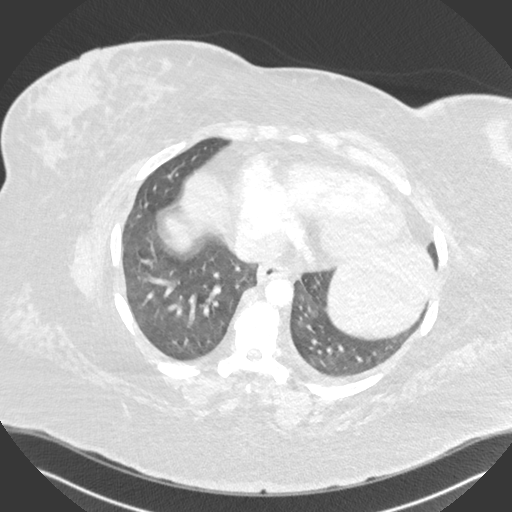
[im 105/315  lung]
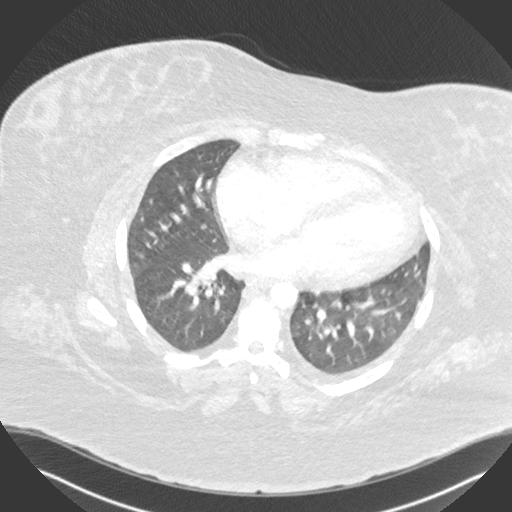
[im 140/315  lung]
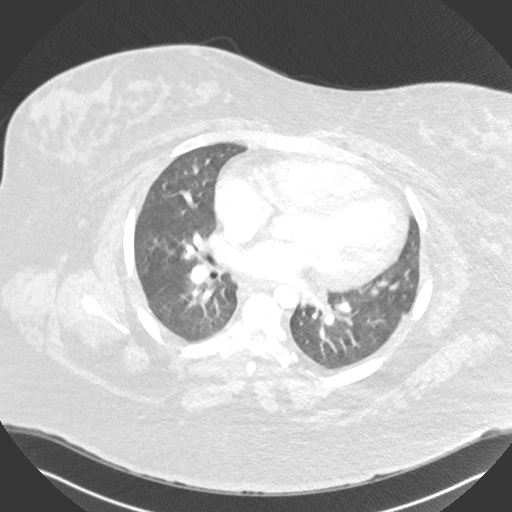
[im 175/315  lung]
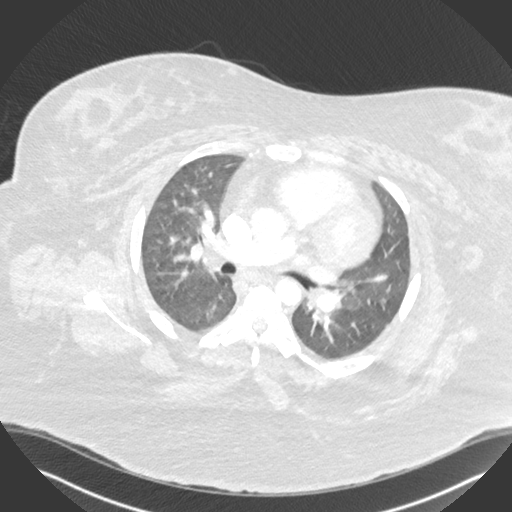
[im 210/315  lung]
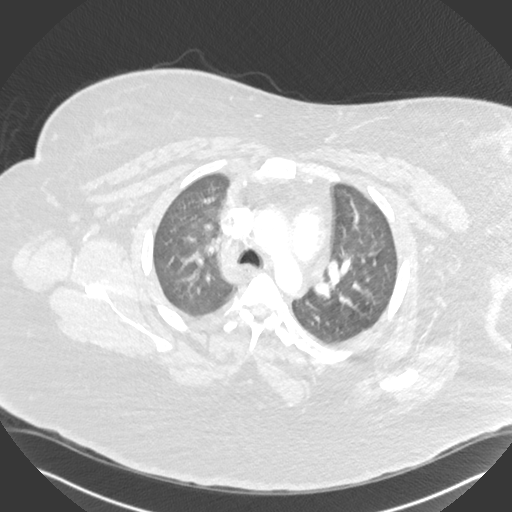
[im 245/315  lung]
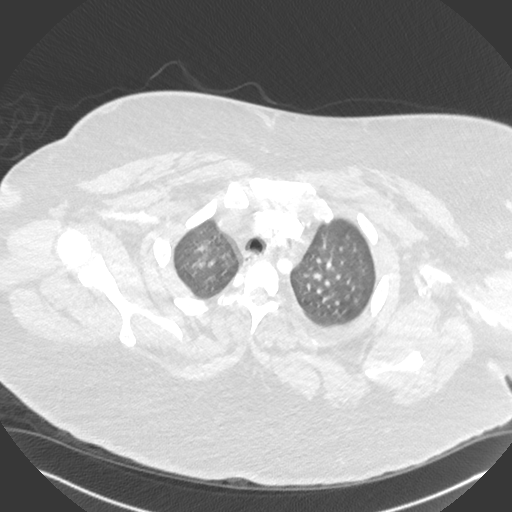
[im 280/315  lung]
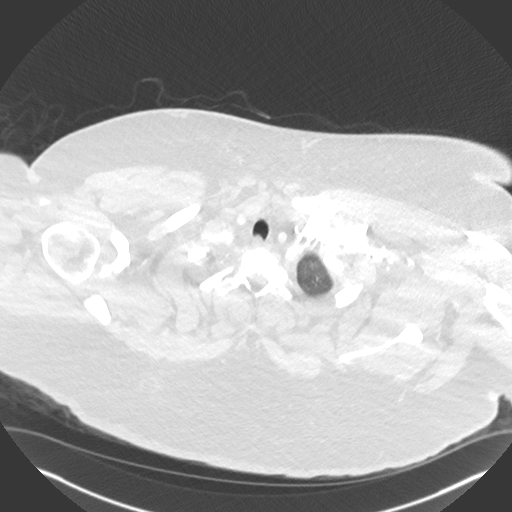

[Series 9: cor · coronal · 0.48mm/px · 3 of 163 slices shown]
[im 41/163  soft-tissue]
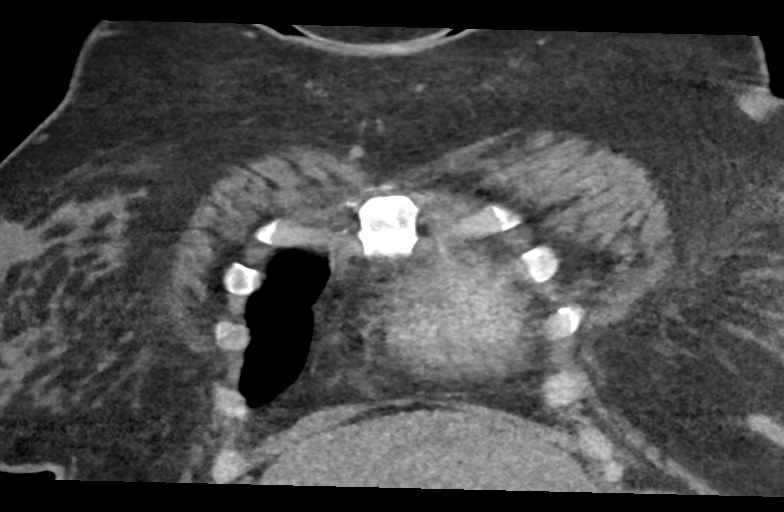
[im 82/163  soft-tissue]
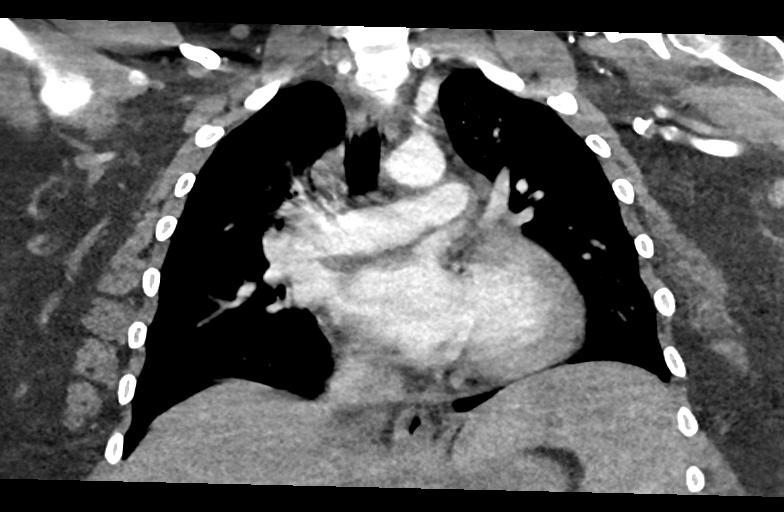
[im 122/163  soft-tissue]
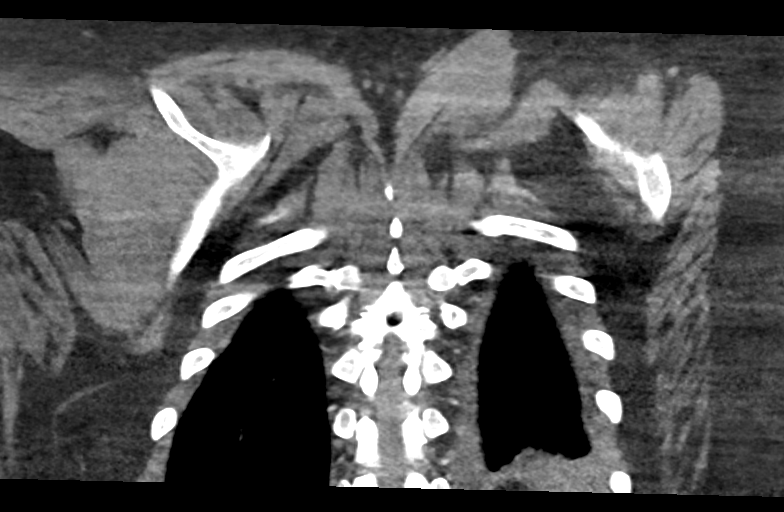

[Series 13: lung · axial · 0.80mm/px · z∈[-86,-50]mm · 2 of 111 slices shown]
[im 19/111  soft-tissue]
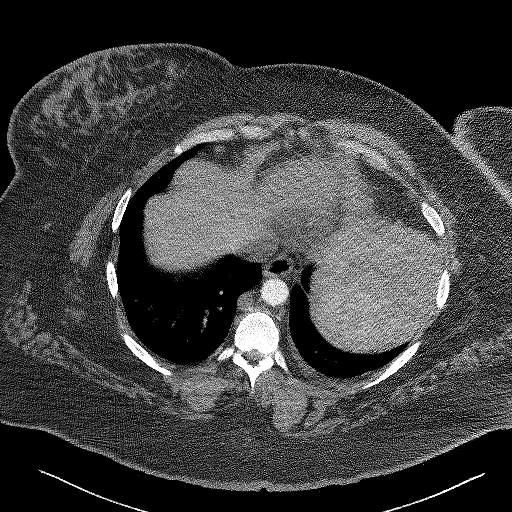
[im 37/111  soft-tissue]
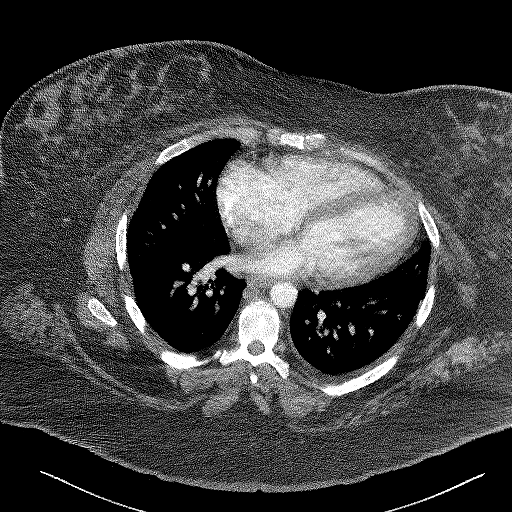

[17 of 46 positions shown; findings below may reference images not displayed]

FINDINGS: Cardiovascular: Satisfactory contrast bolus. Beam attenuation caused
by patient body habitus limits assessment beyond the proximal
segmental level. Within that limitation, there is no central
pulmonary embolus. The size of the main pulmonary artery is normal.
Mild cardiomegaly. The course and caliber of the aorta are normal.
There is no atherosclerotic calcification. Opacification decreased
due to pulmonary arterial phase contrast bolus timing.

Mediastinum/Nodes:

-- No mediastinal lymphadenopathy.

-- No hilar lymphadenopathy.

-- No axillary lymphadenopathy.

-- No supraclavicular lymphadenopathy.

-- Normal thyroid gland where visualized.

-  Unremarkable esophagus.

Lungs/Pleura: There is a trace left-sided pleural effusion. There is
no focal infiltrate. There is no pneumothorax.

Upper Abdomen: Contrast bolus timing is not optimized for evaluation
of the abdominal organs. The visualized portions of the organs of
the upper abdomen are normal.

Musculoskeletal: No chest wall abnormality. No bony spinal canal
stenosis.

Review of the MIP images confirms the above findings.
IMPRESSION: 1. Habitus limited study. Within that limitation, there is no
central pulmonary embolus.
2. Trace left-sided pleural effusion.
3. Mild cardiomegaly.

## 2022-08-11 IMAGING — DX DG CHEST 1V PORT
1 series · 1 of 1 positions shown · non-contrast
Comparison: None.

CLINICAL DATA: Shortness of breath.

EXAM:
PORTABLE CHEST 1 VIEW

[chest ap]
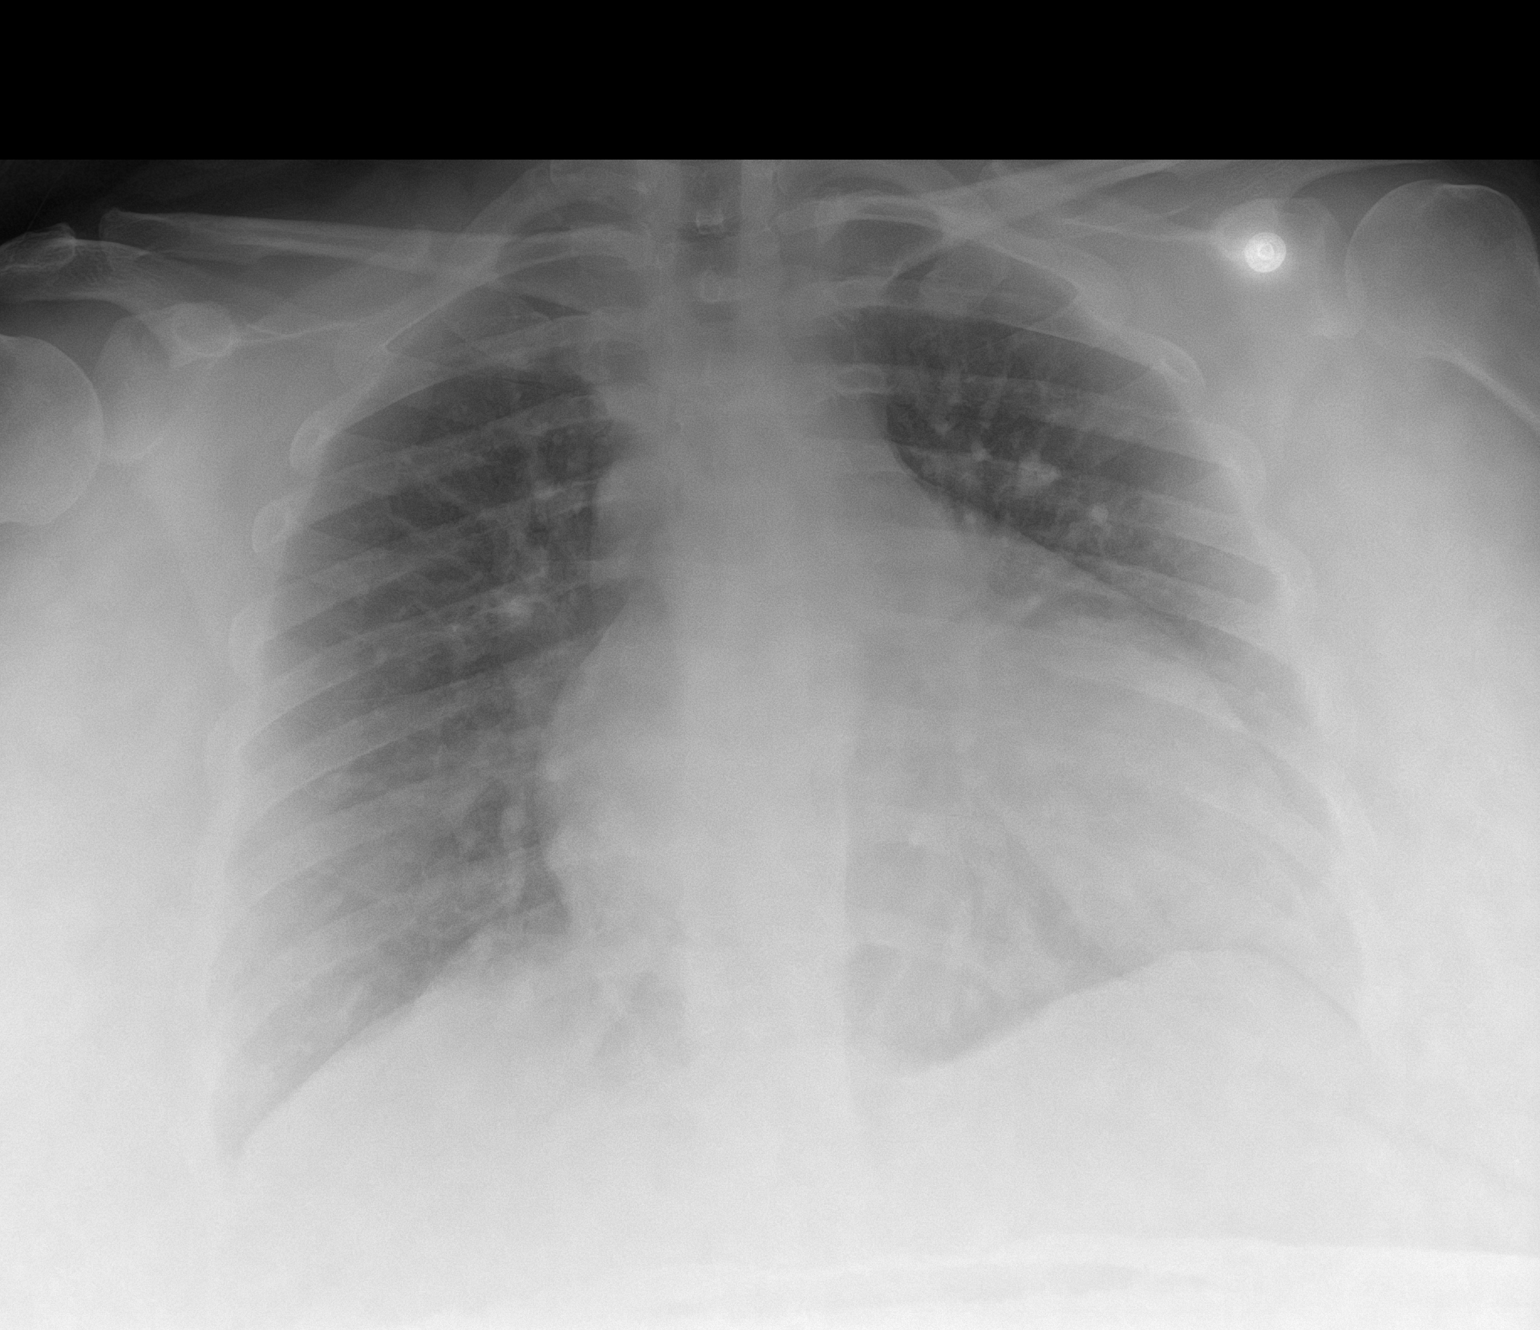

[1 of 1 positions shown; findings below may reference images not displayed]

FINDINGS: Enlarged cardiac silhouette. Otherwise cardiomediastinal silhouette
are unremarkable.

No focal consolidation. No pulmonary edema. No pleural effusion. No
pneumothorax.

No acute osseous abnormality.
IMPRESSION: Enlarged cardiac silhouette.

## 2022-08-29 ENCOUNTER — Other Ambulatory Visit: Payer: Self-pay | Admitting: *Deleted

## 2022-08-29 ENCOUNTER — Telehealth: Payer: Self-pay | Admitting: Hematology and Oncology

## 2022-08-29 MED ORDER — AMOXICILLIN 500 MG PO TABS: 500.0000 mg | ORAL_TABLET | Freq: Three times a day (TID) | ORAL | 0 refills | Status: AC

## 2022-08-29 MED ORDER — AMOXICILLIN-POT CLAVULANATE 875-125 MG PO TABS: 1.0000 | ORAL_TABLET | Freq: Three times a day (TID) | ORAL | 0 refills | Status: DC

## 2022-08-29 NOTE — Telephone Encounter (Signed)
Patient requesting prescription of Amoxicillin for URI symptoms. This will be dispensed to pharmacy of her choice.

## 2022-09-01 ENCOUNTER — Ambulatory Visit
Admission: EM | Admit: 2022-09-01 | Discharge: 2022-09-01 | Disposition: A | Discharge status: Home / Self Care | Payer: 59 | Attending: Emergency Medicine | Admitting: Emergency Medicine

## 2022-09-01 ENCOUNTER — Ambulatory Visit (INDEPENDENT_AMBULATORY_CARE_PROVIDER_SITE_OTHER): Payer: 59

## 2022-09-01 DIAGNOSIS — J210 Acute bronchiolitis due to respiratory syncytial virus: Secondary | ICD-10-CM

## 2022-09-01 DIAGNOSIS — R059 Cough, unspecified: Secondary | ICD-10-CM | POA: Diagnosis not present

## 2022-09-01 DIAGNOSIS — Z1152 Encounter for screening for COVID-19: Secondary | ICD-10-CM | POA: Diagnosis not present

## 2022-09-01 DIAGNOSIS — R509 Fever, unspecified: Secondary | ICD-10-CM | POA: Diagnosis not present

## 2022-09-01 LAB — RESP PANEL BY RT-PCR (RSV, FLU A&B, COVID)  RVPGX2
Influenza A by PCR: NEGATIVE
Influenza B by PCR: NEGATIVE
Resp Syncytial Virus by PCR: POSITIVE — AB
SARS Coronavirus 2 by RT PCR: NEGATIVE

## 2022-09-01 LAB — GROUP A STREP BY PCR: Group A Strep by PCR: NOT DETECTED

## 2022-09-01 MED ORDER — GUAIFENESIN-CODEINE 100-10 MG/5ML PO SYRP: 5.0000 mL | ORAL_SOLUTION | Freq: Three times a day (TID) | ORAL | 0 refills | Status: DC | PRN

## 2022-09-01 MED ORDER — IPRATROPIUM BROMIDE 0.06 % NA SOLN: 2.0000 | Freq: Four times a day (QID) | NASAL | 12 refills | Status: DC

## 2022-09-01 MED ORDER — BENZONATATE 100 MG PO CAPS: 200.0000 mg | ORAL_CAPSULE | Freq: Three times a day (TID) | ORAL | 0 refills | Status: DC

## 2022-09-01 NOTE — ED Triage Notes (Signed)
Pt c/o fever,chills,HA,bodyaches,SOB,wheezing & sore throat x3 days. Son recently diagnosed w/croup. Has hx of asthma, using inhalers w/minor relief. Currently on Amoxicillin TID x7 days.

## 2022-09-01 NOTE — ED Provider Notes (Signed)
[facility]    CSN: [id number] Arrival date & time: 09/01/22  1307      History   Chief Complaint Chief Complaint  Patient presents with   Fever   Chills   Headache   Generalized Body Aches   Sore Throat    HPI Tracy Chang is a 37 y.o. female.   HPI  37 year old female here for evaluation of respiratory complaints.  The patient reports that she has been experiencing symptoms for the last 3 days which includes fevers that will come and go with her most recent fever being 101 last night.  This is also associated with earache, chills, headache, body ache, sore throat, shortness of breath, and wheezing.  Her PCP started her on amoxicillin 3 times a day for presumptive ear infection but the patient states that she does not feel that she is getting any better.  She does endorse runny nose and nasal congestion with yellow nasal discharge and when she is able to bring up mucus it to is yellow in color.  She has been using her inhalers without any significant improvement of her symptoms.  Her son was recently diagnosed with croup.  She has a significant past medical history to include asthma, high cholesterol, prediabetes, hypertension, and a BMI of 56.87.  Past Medical History:  Diagnosis Date   ADD (attention deficit disorder)    Anxiety    Asthma    Back pain    Complication of anesthesia    Conscious sedation-extreme agitation   Fatty liver    Gallbladder problem    Generalized anxiety disorder    Heartburn    High blood pressure    High cholesterol    Irritable bowel syndrome    Joint pain    Multiple food allergies    Post partum depression    Prediabetes    Pregnancy induced hypertension    SOB (shortness of breath)     Patient Active Problem List   Diagnosis Date Noted   Anemia associated with acute blood loss EBL 724 08/07/2020   Anxiety 08/06/2020   Asthma 08/06/2020   Failure to progress in labor 08/06/2020   Status post primary low  transverse cesarean section 12/27 08/06/2020   Postpartum care following cesarean delivery 12/27 08/06/2020   Morbid obesity with body mass index (BMI) of 60.0 to 69.9 in adult [facility]) 08/06/2020   Gestational hypertension 08/05/2020    Past Surgical History:  Procedure Laterality Date   CESAREAN SECTION N/A 08/06/2020   Procedure: CESAREAN SECTION;  Surgeon: Brien Few, MD;  Location: [facility];  Service: Obstetrics;  Laterality: N/A;   CHOLECYSTECTOMY     TONSILLECTOMY      OB History     Gravida  2   Para  1   Term  1   Preterm      AB  1   Living  1      SAB      IAB      Ectopic      Multiple  0   Live Births  1            Home Medications    Prior to Admission medications   Medication Sig Start Date End Date Taking? Authorizing Provider  albuterol (VENTOLIN HFA) 108 (90 Base) MCG/ACT inhaler Inhale 1-2 puffs into the lungs every 6 (six) hours as needed for wheezing or shortness of breath.   Yes [provider]  ALPRAZolam Duanne Moron) 0.5 MG  tablet Take 0.5 mg by mouth at bedtime as needed for anxiety.   Yes [provider]  amoxicillin (AMOXIL) 500 MG tablet Take 1 tablet (500 mg total) by mouth 3 (three) times daily for 7 days. 08/29/22 09/05/22 Yes Rachel Moulds, MD  benzonatate (TESSALON) 100 MG capsule Take 2 capsules (200 mg total) by mouth every 8 (eight) hours. 09/01/22  Yes Becky Augusta, NP  cetirizine (ZYRTEC) 10 MG tablet Take 10 mg by mouth daily.   Yes [provider]  cyclobenzaprine (FLEXERIL) 10 MG tablet Take 1 tablet (10 mg total) by mouth at bedtime. 06/23/22  Yes Richardean Sale, DO  guaiFENesin-codeine (ROBITUSSIN AC) 100-10 MG/5ML syrup Take 5 mLs by mouth 3 (three) times daily as needed for cough. 09/01/22  Yes Becky Augusta, NP  ipratropium (ATROVENT) 0.06 % nasal spray Place 2 sprays into both nostrils 4 (four) times daily. 09/01/22  Yes Becky Augusta, NP  metFORMIN (GLUCOPHAGE-XR) 500 MG 24 hr tablet Take  1 tablet (500 mg total) by mouth daily with breakfast. 05/13/22  Yes Langston Reusing, MD  Prenatal Vit-Fe Fumarate-FA (PRENATAL MULTIVITAMIN) TABS tablet Take 1 tablet by mouth daily at 12 noon.   Yes [provider]  sertraline (ZOLOFT) 100 MG tablet Take 1 tablet (100 mg total) by mouth daily. Patient taking differently: Take 150 mg by mouth daily. 10/11/20  Yes   valACYclovir (VALTREX) 1000 MG tablet Take 1,000 mg by mouth as needed.   Yes [provider]  Vitamin D, Ergocalciferol, (DRISDOL) 1.25 MG (50000 UNIT) CAPS capsule Take 1 capsule (50,000 Units total) by mouth every 7 (seven) days. 06/01/22  Yes Langston Reusing, MD    Family History Family History  Problem Relation Age of Onset   Cancer Mother    Hyperlipidemia Mother    Hypertension Mother    Heart disease Mother    Kidney disease Mother    Depression Mother    Anxiety disorder Mother    Sleep apnea Mother    Eating disorder Mother    Obesity Mother    Cancer Father    Heart disease Father     Social History Social History   Tobacco Use   Smoking status: Never   Smokeless tobacco: Never  Vaping Use   Vaping Use: Never used  Substance Use Topics   Alcohol use: Not Currently    Comment: SOCIAL   Drug use: Never     Allergies   Augmentin [amoxicillin-pot clavulanate], Ceclor [cefaclor], Demerol [meperidine], Meloxicam, Latex, Versed [midazolam], and Compazine [prochlorperazine]   Review of Systems Review of Systems  Constitutional:  Positive for fever.  HENT:  Positive for congestion, ear pain, rhinorrhea and sore throat.   Respiratory:  Positive for cough, shortness of breath and wheezing.   Musculoskeletal:  Positive for arthralgias and myalgias.  Neurological:  Positive for headaches.  Hematological: Negative.   Psychiatric/Behavioral: Negative.       Physical Exam Triage Vital Signs ED Triage Vitals  Enc Vitals Group     BP 09/01/22 1321 126/66     Pulse Rate  09/01/22 1321 86     Resp 09/01/22 1321 16     Temp 09/01/22 1321 99.4 F (37.4 C)     Temp Source 09/01/22 1321 Axillary     SpO2 09/01/22 1321 98 %     Weight 09/01/22 1319 (!) 301 lb (136.5 kg)     Height 09/01/22 1319 5\' 1"  (1.549 m)     Head Circumference --  Peak Flow --      Pain Score 09/01/22 1318 7     Pain Loc --      Pain Edu? --      Excl. in [place]? --    No data found.  Updated Vital Signs BP 126/66 (BP Location: Left Arm)   Pulse 86   Temp 99.4 F (37.4 C) (Axillary)   Resp 16   Ht 5\' 1"  (1.549 m)   Wt (!) 301 lb (136.5 kg)   LMP 08/25/2022 (Exact Date)   SpO2 98%   BMI 56.87 kg/m   Visual Acuity Right Eye Distance:   Left Eye Distance:   Bilateral Distance:    Right Eye Near:   Left Eye Near:    Bilateral Near:     Physical Exam Vitals and nursing note reviewed.  Constitutional:      Appearance: Normal appearance. She is not ill-appearing.  HENT:     Head: Normocephalic and atraumatic.     Right Ear: Tympanic membrane, ear canal and external ear normal. There is no impacted cerumen.     Left Ear: Tympanic membrane, ear canal and external ear normal. There is no impacted cerumen.     Nose: Congestion and rhinorrhea present.     Comments: Nasal mucosa is erythematous and edematous with clear discharge in both nares.    Mouth/Throat:     Mouth: Mucous membranes are moist.     Pharynx: Oropharynx is clear. Posterior oropharyngeal erythema present. No oropharyngeal exudate.     Comments: Tonsillar pillars are surgically absent.  The posterior oropharynx is erythematous and injected with clear postnasal drip. Cardiovascular:     Rate and Rhythm: Normal rate and regular rhythm.     Pulses: Normal pulses.     Heart sounds: Normal heart sounds. No murmur heard.    No friction rub. No gallop.  Pulmonary:     Effort: Pulmonary effort is normal.     Breath sounds: Rales present. No wheezing or rhonchi.     Comments: Is coarse crackles in the left  upper lobe posteriorly.  Remainder the lung fields are clear to auscultation. Musculoskeletal:     Cervical back: Normal range of motion and neck supple.  Lymphadenopathy:     Cervical: No cervical adenopathy.  Skin:    General: Skin is warm and dry.     Capillary Refill: Capillary refill takes less than 2 seconds.     Findings: No erythema or rash.  Neurological:     General: No focal deficit present.     Mental Status: She is alert and oriented to person, place, and time.  Psychiatric:        Mood and Affect: Mood normal.        Behavior: Behavior normal.        Thought Content: Thought content normal.        Judgment: Judgment normal.      UC Treatments / Results  Labs (all labs ordered are listed, but only abnormal results are displayed) Labs Reviewed  RESP PANEL BY RT-PCR (RSV, FLU A&B, COVID)  RVPGX2 - Abnormal; Notable for the following components:      Result Value   Resp Syncytial Virus by PCR POSITIVE (*)    All other components within normal limits  GROUP A STREP BY PCR    EKG   Radiology DG Chest 2 View  Result Date: 09/01/2022 CLINICAL DATA:  Cough, fever. EXAM: CHEST - 2 VIEW COMPARISON:  July 16, 2020.  FINDINGS: The heart size and mediastinal contours are within normal limits. Both lungs are clear. The visualized skeletal structures are unremarkable. IMPRESSION: No active cardiopulmonary disease. Electronically Signed   By: Marijo Conception M.D.   On: 09/01/2022 13:51    Procedures Procedures (including critical care time)  Medications Ordered in UC Medications - No data to display  Initial Impression / Assessment and Plan / UC Course  I have reviewed the triage vital signs and the nursing notes.  Pertinent labs & imaging results that were available during my care of the patient were reviewed by me and considered in my medical decision making (see chart for details).   Patient is a pleasant, nontoxic-appearing 37 year old female here for evaluation  of respiratory symptoms as outlined in HPI above.  She does have inflamed nasal mucosa with clear nasal discharge and clear postnasal drip with posterior oropharyngeal erythema and injection but no purulent discharge in her nasal passages.  I do not appreciate any cervical lymphadenopathy.  She does have coarse crackles in the left upper lobe posteriorly.  The remainder of her lung fields are clear to auscultation.  Respiratory panel and strep PCR were collected at triage.  She has been on amoxicillin which should treat strep.  I will obtain a chest x-ray to rule out the presence of pneumonia given the adventitious lung sounds on exam.  Strep PCR is negative.  Respiratory panel is positive for RSV.  Radiology report of chest x-ray states no active cardiopulmonary disease.  I will discharge patient home with a diagnosis of RSV.  I will prescribe Atrovent nasal spray to help her with her congestion along with Tessalon Perles and Cheratussin cough syrup.  She can continue to use her inhaler as needed for shortness of breath or wheezing.  Return and ER precautions reviewed.  Work note provided.   Final Clinical Impressions(s) / UC Diagnoses   Final diagnoses:  Acute bronchiolitis due to respiratory syncytial virus (RSV)     Discharge Instructions      Your testing today was negative for COVID, influenza, strep, and pneumonia.  You did test positive for RSV however.  RSV is a respiratory virus and will have to run its course.  Typically takes 7 to 10 days.  You can continue to use your albuterol inhaler as needed for shortness of breath or wheezing.  Take over-the-counter Tylenol and/or ibuprofen according to package instructions as needed for pain or fever.  Use the Atrovent nasal spray, 2 squirts in each nostril every 6 hours, as needed for runny nose and postnasal drip.  Use the Tessalon Perles every 8 hours during the day.  Take them with a small sip of water.  They may give you some  numbness to the base of your tongue or a metallic taste in your mouth, this is normal.  Use the Cheratussin cough syrup at bedtime for cough and congestion.  It will make you drowsy so do not take it during the day.  Return for reevaluation or see your primary care provider for any new or worsening symptoms.      ED Prescriptions     Medication Sig Dispense Auth. Provider   benzonatate (TESSALON) 100 MG capsule Take 2 capsules (200 mg total) by mouth every 8 (eight) hours. 21 capsule Margarette Canada, NP   ipratropium (ATROVENT) 0.06 % nasal spray Place 2 sprays into both nostrils 4 (four) times daily. 15 mL Margarette Canada, NP   guaiFENesin-codeine [facility]) 100-10 MG/5ML syrup Take  5 mLs by mouth 3 (three) times daily as needed for cough. 120 mL Margarette Canada, NP      I have reviewed the PDMP during this encounter.   Margarette Canada, NP 09/01/22 1420

## 2022-09-01 NOTE — Discharge Instructions (Signed)
Your testing today was negative for COVID, influenza, strep, and pneumonia.  You did test positive for RSV however.  RSV is a respiratory virus and will have to run its course.  Typically takes 7 to 10 days.  You can continue to use your albuterol inhaler as needed for shortness of breath or wheezing.  Take over-the-counter Tylenol and/or ibuprofen according to package instructions as needed for pain or fever.  Use the Atrovent nasal spray, 2 squirts in each nostril every 6 hours, as needed for runny nose and postnasal drip.  Use the Tessalon Perles every 8 hours during the day.  Take them with a small sip of water.  They may give you some numbness to the base of your tongue or a metallic taste in your mouth, this is normal.  Use the Cheratussin cough syrup at bedtime for cough and congestion.  It will make you drowsy so do not take it during the day.  Return for reevaluation or see your primary care provider for any new or worsening symptoms.

## 2022-10-18 ENCOUNTER — Encounter (HOSPITAL_COMMUNITY): Payer: Self-pay

## 2022-10-18 ENCOUNTER — Ambulatory Visit (HOSPITAL_COMMUNITY)
Admission: EM | Admit: 2022-10-18 | Discharge: 2022-10-18 | Disposition: A | Discharge status: Home / Self Care | Payer: 59 | Attending: Physician Assistant | Admitting: Physician Assistant

## 2022-10-18 DIAGNOSIS — M545 Low back pain, unspecified: Secondary | ICD-10-CM | POA: Diagnosis not present

## 2022-10-18 DIAGNOSIS — M62838 Other muscle spasm: Secondary | ICD-10-CM | POA: Diagnosis not present

## 2022-10-18 DIAGNOSIS — T148XXA Other injury of unspecified body region, initial encounter: Secondary | ICD-10-CM | POA: Diagnosis not present

## 2022-10-18 MED ORDER — PREDNISONE 10 MG PO TABS: 10.0000 mg | ORAL_TABLET | Freq: Three times a day (TID) | ORAL | 0 refills | Status: DC

## 2022-10-18 MED ORDER — METHOCARBAMOL 500 MG PO TABS: 500.0000 mg | ORAL_TABLET | Freq: Two times a day (BID) | ORAL | 0 refills | Status: DC

## 2022-10-18 MED ORDER — METHYLPREDNISOLONE SODIUM SUCC 125 MG IJ SOLR
60.0000 mg | Freq: Once | INTRAMUSCULAR | Status: AC
  Administered 2022-10-18: 60 mg via INTRAMUSCULAR

## 2022-10-18 MED ORDER — KETOROLAC TROMETHAMINE 30 MG/ML IJ SOLN
INTRAMUSCULAR | Status: AC
  Filled 2022-10-18: qty 1

## 2022-10-18 MED ORDER — KETOROLAC TROMETHAMINE 30 MG/ML IJ SOLN
30.0000 mg | Freq: Once | INTRAMUSCULAR | Status: AC
  Administered 2022-10-18: 30 mg via INTRAMUSCULAR

## 2022-10-18 MED ORDER — METHYLPREDNISOLONE SODIUM SUCC 125 MG IJ SOLR
INTRAMUSCULAR | Status: AC
  Filled 2022-10-18: qty 2

## 2022-10-18 NOTE — ED Provider Notes (Signed)
[place]    CSN: [id number] Arrival date & time: 10/18/22  1733      History   Chief Complaint Chief Complaint  Patient presents with   Back Pain    HPI Tracy Chang is a 37 y.o. female.   37 year old female presents with lower back pain.  Patient indicates she has a history of having lower back pain on intermittent basis, sciatica.  She indicates she does have an orthopedist that she sees on a regular basis.  She relates that this morning she was carrying out the trash, and when she bent over and felt a sharp pain in the lower part of the back which sent her to her knees.  She indicates she took several minutes before she can get up.  She has been having lower back pain since then and that is localized in the lower part of the back on the right side but also moves across to the left side.  She indicates the pain is worse when she stands, turns, or bends.  She indicates she is not having any numbness, tingling, or weakness of the lower extremities.  She indicates that usually she does well and gets relief of the pain and discomfort with Toradol and steroid injection.  She is tolerating fluids well.   Back Pain   Past Medical History:  Diagnosis Date   ADD (attention deficit disorder)    Anxiety    Asthma    Back pain    Complication of anesthesia    Conscious sedation-extreme agitation   Fatty liver    Gallbladder problem    Generalized anxiety disorder    Heartburn    High blood pressure    High cholesterol    Irritable bowel syndrome    Joint pain    Multiple food allergies    Post partum depression    Prediabetes    Pregnancy induced hypertension    SOB (shortness of breath)     Patient Active Problem List   Diagnosis Date Noted   Anemia associated with acute blood loss EBL 724 08/07/2020   Anxiety 08/06/2020   Asthma 08/06/2020   Failure to progress in labor 08/06/2020   Status post primary low transverse cesarean section 12/27  08/06/2020   Postpartum care following cesarean delivery 12/27 08/06/2020   Morbid obesity with body mass index (BMI) of 60.0 to 69.9 in adult [facility]) 08/06/2020   Gestational hypertension 08/05/2020    Past Surgical History:  Procedure Laterality Date   CESAREAN SECTION N/A 08/06/2020   Procedure: CESAREAN SECTION;  Surgeon: Brien Few, MD;  Location: [facility];  Service: Obstetrics;  Laterality: N/A;   CHOLECYSTECTOMY     TONSILLECTOMY      OB History     Gravida  2   Para  1   Term  1   Preterm      AB  1   Living  1      SAB      IAB      Ectopic      Multiple  0   Live Births  1            Home Medications    Prior to Admission medications   Medication Sig Start Date End Date Taking? Authorizing Provider  albuterol (VENTOLIN HFA) 108 (90 Base) MCG/ACT inhaler Inhale 1-2 puffs into the lungs every 6 (six) hours as needed for wheezing or shortness of breath.   Yes [provider]  ALPRAZolam (XANAX) 0.5 MG tablet Take 0.5 mg by mouth at bedtime as needed for anxiety.   Yes [provider]  benzonatate (TESSALON) 100 MG capsule Take 2 capsules (200 mg total) by mouth every 8 (eight) hours. 09/01/22  Yes Margarette Canada, NP  cetirizine (ZYRTEC) 10 MG tablet Take 10 mg by mouth daily.   Yes [provider]  cyclobenzaprine (FLEXERIL) 10 MG tablet Take 1 tablet (10 mg total) by mouth at bedtime. 06/23/22  Yes Glennon Mac, DO  guaiFENesin-codeine (ROBITUSSIN AC) 100-10 MG/5ML syrup Take 5 mLs by mouth 3 (three) times daily as needed for cough. 09/01/22  Yes Margarette Canada, NP  ipratropium (ATROVENT) 0.06 % nasal spray Place 2 sprays into both nostrils 4 (four) times daily. 09/01/22  Yes Margarette Canada, NP  metFORMIN (GLUCOPHAGE-XR) 500 MG 24 hr tablet Take 1 tablet (500 mg total) by mouth daily with breakfast. 05/13/22  Yes Laqueta Linden, MD  methocarbamol (ROBAXIN) 500 MG tablet Take 1 tablet (500 mg total) by mouth 2 (two)  times daily. 10/18/22  Yes Nyoka Lint, PA-C  predniSONE (DELTASONE) 10 MG tablet Take 1 tablet (10 mg total) by mouth in the morning, at noon, and at bedtime. 10/18/22  Yes Nyoka Lint, PA-C  Prenatal Vit-Fe Fumarate-FA (PRENATAL MULTIVITAMIN) TABS tablet Take 1 tablet by mouth daily at 12 noon.   Yes [provider]  sertraline (ZOLOFT) 100 MG tablet Take 1 tablet (100 mg total) by mouth daily. Patient taking differently: Take 150 mg by mouth daily. 10/11/20  Yes   valACYclovir (VALTREX) 1000 MG tablet Take 1,000 mg by mouth as needed.   Yes [provider]  Vitamin D, Ergocalciferol, (DRISDOL) 1.25 MG (50000 UNIT) CAPS capsule Take 1 capsule (50,000 Units total) by mouth every 7 (seven) days. 06/01/22  Yes Laqueta Linden, MD    Family History Family History  Problem Relation Age of Onset   Cancer Mother    Hyperlipidemia Mother    Hypertension Mother    Heart disease Mother    Kidney disease Mother    Depression Mother    Anxiety disorder Mother    Sleep apnea Mother    Eating disorder Mother    Obesity Mother    Cancer Father    Heart disease Father     Social History Social History   Tobacco Use   Smoking status: Never   Smokeless tobacco: Never  Vaping Use   Vaping Use: Never used  Substance Use Topics   Alcohol use: Not Currently    Comment: SOCIAL   Drug use: Never     Allergies   Augmentin [amoxicillin-pot clavulanate], Ceclor [cefaclor], Demerol [meperidine], Meloxicam, Latex, Versed [midazolam], and Compazine [prochlorperazine]   Review of Systems Review of Systems  Musculoskeletal:  Positive for back pain (lower back right side).     Physical Exam Triage Vital Signs ED Triage Vitals  Enc Vitals Group     BP 10/18/22 1830 (!) 152/78     Pulse Rate 10/18/22 1830 85     Resp 10/18/22 1830 18     Temp 10/18/22 1830 98.3 F (36.8 C)     Temp Source 10/18/22 1830 Oral     SpO2 10/18/22 1830 93 %     Weight --      Height  10/18/22 1830 '5\' 2"'$  (1.575 m)     Head Circumference --      Peak Flow --      Pain Score 10/18/22 1829 9  Pain Loc --      Pain Edu? --      Excl. in [place]? --    No data found.  Updated Vital Signs BP (!) 152/78 (BP Location: Right Arm)   Pulse 85   Temp 98.3 F (36.8 C) (Oral)   Resp 18   Ht '5\' 2"'$  (1.575 m)   LMP 10/18/2022 (Exact Date)   SpO2 93%   BMI 55.05 kg/m   Visual Acuity Right Eye Distance:   Left Eye Distance:   Bilateral Distance:    Right Eye Near:   Left Eye Near:    Bilateral Near:     Physical Exam Constitutional:      Appearance: Normal appearance.  Musculoskeletal:       Back:     Comments: Back: Pain is palpated along the L3-L5 paraspinous areas bilaterally and midline, no unusual redness or swelling at site.  There is increase in pain with flexion of both legs, negative pain with dorsiflexion of the feet bilaterally.  Range of motion is limited with pain on movement, bending.  Strength is normal bilaterally.  Neurological:     Mental Status: She is alert.      UC Treatments / Results  Labs (all labs ordered are listed, but only abnormal results are displayed) Labs Reviewed - No data to display  EKG   Radiology No results found.  Procedures Procedures (including critical care time)  Medications Ordered in UC Medications  ketorolac (TORADOL) 30 MG/ML injection 30 mg (has no administration in time range)  methylPREDNISolone sodium succinate (SOLU-MEDROL) 125 mg/2 mL injection 60 mg (has no administration in time range)    Initial Impression / Assessment and Plan / UC Course  I have reviewed the triage vital signs and the nursing notes.  Pertinent labs & imaging results that were available during my care of the patient were reviewed by me and considered in my medical decision making (see chart for details).    Plan: The diagnosis will be treated with the following: 1.  Acute low back pain without sciatica: A.  Toradol 30 mg IM  given. B.  Solu-Medrol 125 mg / 2 mL, 60 mg given IM today. 2.  Muscle spasm: A.  Advised to use ice therapy, 10 minutes on 20 minutes off, 3-4 times throughout the day to help reduce spasm and pain. B.  Robaxin 500 mg every 12 hours to help reduce muscle spasm and irritability. C.  Advised take Tylenol for pain relief. 3.  Advised to follow-up with orthopedic specialist if symptoms fail to improve over the next couple days. Final Clinical Impressions(s) / UC Diagnoses   Final diagnoses:  Acute bilateral low back pain without sciatica  Muscle strain  Muscle spasm     Discharge Instructions      Advised to use ice therapy, 10 minutes on 20 minutes off, 3-4 times throughout the evening to help reduce muscle spasm and discomfort. Advised take Robaxin 500 mg every 12 hours to help reduce muscle spasm and irritability.  Advised follow-up PCP or return to urgent care as needed.    ED Prescriptions     Medication Sig Dispense Auth. Provider   methocarbamol (ROBAXIN) 500 MG tablet Take 1 tablet (500 mg total) by mouth 2 (two) times daily. 20 tablet Nyoka Lint, PA-C   predniSONE (DELTASONE) 10 MG tablet Take 1 tablet (10 mg total) by mouth in the morning, at noon, and at bedtime. 10 tablet Nyoka Lint, PA-C  PDMP not reviewed this encounter.   Nyoka Lint, PA-C 10/18/22 1858

## 2022-10-18 NOTE — ED Triage Notes (Signed)
Patient here today with complaints of LB pain X 2 days. Patient had increased LB pain today when she was taking out the trash. She could not get in a comfortable position. She states that she is having some tightness and intermittent spasms. Helps to lay on her side.

## 2022-10-18 NOTE — Discharge Instructions (Signed)
Advised to use ice therapy, 10 minutes on 20 minutes off, 3-4 times throughout the evening to help reduce muscle spasm and discomfort. Advised take Robaxin 500 mg every 12 hours to help reduce muscle spasm and irritability.  Advised follow-up PCP or return to urgent care as needed.

## 2023-02-05 DIAGNOSIS — N760 Acute vaginitis: Secondary | ICD-10-CM | POA: Diagnosis not present

## 2023-02-05 DIAGNOSIS — L659 Nonscarring hair loss, unspecified: Secondary | ICD-10-CM | POA: Diagnosis not present

## 2023-02-20 ENCOUNTER — Other Ambulatory Visit (HOSPITAL_COMMUNITY): Payer: Self-pay

## 2023-02-20 MED ORDER — SERTRALINE HCL 100 MG PO TABS
150.0000 mg | ORAL_TABLET | Freq: Every day | ORAL | 6 refills | Status: DC
  Filled 2023-02-20: qty 135, 90d supply, fill #0

## 2023-02-20 MED ORDER — MONTELUKAST SODIUM 10 MG PO TABS
10.0000 mg | ORAL_TABLET | Freq: Every day | ORAL | 2 refills | Status: DC
  Filled 2023-02-20: qty 30, 30d supply, fill #0
  Filled 2023-03-23: qty 30, 30d supply, fill #1

## 2023-02-23 ENCOUNTER — Other Ambulatory Visit (HOSPITAL_COMMUNITY): Payer: Self-pay

## 2023-03-13 ENCOUNTER — Other Ambulatory Visit (HOSPITAL_COMMUNITY): Payer: Self-pay

## 2023-03-23 ENCOUNTER — Other Ambulatory Visit (HOSPITAL_COMMUNITY): Payer: Self-pay

## 2023-03-28 ENCOUNTER — Other Ambulatory Visit (HOSPITAL_COMMUNITY): Payer: Self-pay

## 2023-05-18 DIAGNOSIS — R300 Dysuria: Secondary | ICD-10-CM | POA: Diagnosis not present

## 2023-05-18 DIAGNOSIS — N979 Female infertility, unspecified: Secondary | ICD-10-CM | POA: Diagnosis not present

## 2023-06-02 ENCOUNTER — Other Ambulatory Visit (HOSPITAL_COMMUNITY): Payer: Self-pay

## 2023-06-02 MED ORDER — MONTELUKAST SODIUM 10 MG PO TABS
10.0000 mg | ORAL_TABLET | Freq: Every day | ORAL | 2 refills | Status: DC
  Filled 2023-06-02 – 2023-07-31 (×2): qty 30, 30d supply, fill #0
  Filled 2023-10-07: qty 30, 30d supply, fill #1
  Filled 2023-11-09: qty 30, 30d supply, fill #2

## 2023-06-03 DIAGNOSIS — N979 Female infertility, unspecified: Secondary | ICD-10-CM | POA: Diagnosis not present

## 2023-06-09 DIAGNOSIS — Z319 Encounter for procreative management, unspecified: Secondary | ICD-10-CM | POA: Diagnosis not present

## 2023-06-09 DIAGNOSIS — N979 Female infertility, unspecified: Secondary | ICD-10-CM | POA: Diagnosis not present

## 2023-06-15 ENCOUNTER — Other Ambulatory Visit (HOSPITAL_COMMUNITY): Payer: Self-pay

## 2023-06-16 DIAGNOSIS — Z3141 Encounter for fertility testing: Secondary | ICD-10-CM | POA: Diagnosis not present

## 2023-06-24 NOTE — Progress Notes (Unsigned)
Tracy Chang D.[facility] [address] [place] [address] Phone: [phone]   Assessment and Plan:    1. Chronic bilateral low back pain without sciatica -Chronic with exacerbation, subsequent visit - Recurrent low back pain, however low back pain is currently nonradiating, worse on right without specific MOI, and has been progressing for months -Patient is in the process of trying to get pregnant.  Discussed that there are several treatment options that I would not recommend if patient is actively trying to get pregnant.  She stated that she will stop trying to get pregnant the next 2 to 3 weeks or until reevaluated so that we have more available treatment options.  LMP 06/22/2023, so patient states she cannot currently be pregnant - X-ray will be obtained in clinic.  Can be reviewed at follow-up visit - Patient elected for IM injection of methylprednisone 80 mg/Toradol 60 mg.  Injection given in clinic today and tolerated well. - Patient states that she had an angioedema reaction to meloxicam, however she has had no reaction to ibuprofen, naproxen in the past.  Elects to proceed with naproxen course.  If angioedema presents, recommend discontinuing medication, using Benadryl, and ER evaluation - Start naproxen 500 mg twice daily x2 weeks.  If still having pain after 2 weeks, complete 3rd-week of naproxen. May use remaining naproxen as needed once daily for pain control.  Do not to use additional NSAIDs while taking meloxicam.  May use Tylenol [id number] mg 2 to 3 times a day for breakthrough pain. -Start HEP for low back and core  Pertinent previous records reviewed include none  Follow Up: 3 weeks for reevaluation.  Would review x-ray.  Could consider physical therapy versus OMT if no improvement or worsening of symptoms   Subjective:   I, Tracy Chang, am serving as a Neurosurgeon for Doctor Richardean Sale  Chief Complaint: low back pain    HPI:   06/25/2023 Patient is 37 year old female with concerns of low back pain . Patient states that she has pain with walking, if she sits the pain will ease. Pain started a couple of months ago. Pain does not radiate . The pain is a jolting shooting pain. Tylenol and ibu don't work. She is trying to get pregnant    Relevant Historical Information: None pertinent  Additional pertinent review of systems negative.   Current Outpatient Medications:    albuterol (VENTOLIN HFA) 108 (90 Base) MCG/ACT inhaler, Inhale 1-2 puffs into the lungs every 6 (six) hours as needed for wheezing or shortness of breath., Disp: , Rfl:    ALPRAZolam (XANAX) 0.5 MG tablet, Take 0.5 mg by mouth at bedtime as needed for anxiety., Disp: , Rfl:    benzonatate (TESSALON) 100 MG capsule, Take 2 capsules (200 mg total) by mouth every 8 (eight) hours., Disp: 21 capsule, Rfl: 0   cetirizine (ZYRTEC) 10 MG tablet, Take 10 mg by mouth daily., Disp: , Rfl:    cyclobenzaprine (FLEXERIL) 10 MG tablet, Take 1 tablet (10 mg total) by mouth at bedtime., Disp: 30 tablet, Rfl: 0   guaiFENesin-codeine (ROBITUSSIN AC) 100-10 MG/5ML syrup, Take 5 mLs by mouth 3 (three) times daily as needed for cough., Disp: 120 mL, Rfl: 0   ipratropium (ATROVENT) 0.06 % nasal spray, Place 2 sprays into both nostrils 4 (four) times daily., Disp: 15 mL, Rfl: 12   metFORMIN (GLUCOPHAGE-XR) 500 MG 24 hr tablet, Take 1 tablet (500 mg total) by mouth daily with  breakfast., Disp: 30 tablet, Rfl: 0   methocarbamol (ROBAXIN) 500 MG tablet, Take 1 tablet (500 mg total) by mouth 2 (two) times daily., Disp: 20 tablet, Rfl: 0   montelukast (SINGULAIR) 10 MG tablet, Take 1 tablet (10 mg total) by mouth at bedtime., Disp: 30 tablet, Rfl: 2   naproxen (NAPROSYN) 500 MG tablet, Take 1 tablet (500 mg total) by mouth 2 (two) times daily as needed., Disp: 30 tablet, Rfl: 0   predniSONE (DELTASONE) 10 MG tablet, Take 1 tablet (10 mg total) by mouth in the morning, at  noon, and at bedtime., Disp: 10 tablet, Rfl: 0   Prenatal Vit-Fe Fumarate-FA (PRENATAL MULTIVITAMIN) TABS tablet, Take 1 tablet by mouth daily at 12 noon., Disp: , Rfl:    sertraline (ZOLOFT) 100 MG tablet, Take 1 tablet (100 mg total) by mouth daily. (Patient taking differently: Take 150 mg by mouth daily.), Disp: 60 tablet, Rfl: 0   sertraline (ZOLOFT) 100 MG tablet, Take 1 and 1/2 tablet (150 mg total) by mouth daily., Disp: 135 tablet, Rfl: 6   valACYclovir (VALTREX) 1000 MG tablet, Take 1,000 mg by mouth as needed., Disp: , Rfl:    Vitamin D, Ergocalciferol, (DRISDOL) 1.25 MG (50000 UNIT) CAPS capsule, Take 1 capsule (50,000 Units total) by mouth every 7 (seven) days., Disp: 4 capsule, Rfl: 0   Objective:     Vitals:   06/25/23 1336  Pulse: 75  SpO2: 98%  Weight: (!) 301 lb (136.5 kg)  Height: 5\' 2"  (1.575 m)      Body mass index is 55.05 kg/m.    Physical Exam:    Gen: Appears well, nad, nontoxic and pleasant Psych: Alert and oriented, appropriate mood and affect Neuro: sensation intact, strength is 5/5 in upper and lower extremities, muscle tone wnl Skin: no susupicious lesions or rashes  Back - Normal skin, Spine with normal alignment and no deformity.   tenderness to lumbar vertebral process palpation.   Bilateral lumbar paraspinous muscles are tender and without spasm, worse on right NTTP gluteal musculature Straight leg raise negative for radicular pain, though reproduced right-sided low back pain Piriformis Test negative Gait normal  Low back pain mildly worsened by lumbar extension.  No pain with rotation  Electronically signed by:  Tracy Chang D.[facility] 2:02 PM 06/25/23

## 2023-06-25 ENCOUNTER — Ambulatory Visit (INDEPENDENT_AMBULATORY_CARE_PROVIDER_SITE_OTHER): Payer: 59

## 2023-06-25 ENCOUNTER — Ambulatory Visit: Payer: 59 | Admitting: Sports Medicine

## 2023-06-25 VITALS — HR 75 | Ht 62.0 in | Wt 301.0 lb

## 2023-06-25 DIAGNOSIS — G8929 Other chronic pain: Secondary | ICD-10-CM

## 2023-06-25 DIAGNOSIS — M48061 Spinal stenosis, lumbar region without neurogenic claudication: Secondary | ICD-10-CM | POA: Diagnosis not present

## 2023-06-25 DIAGNOSIS — M545 Low back pain, unspecified: Secondary | ICD-10-CM

## 2023-06-25 DIAGNOSIS — M5136 Other intervertebral disc degeneration, lumbar region with discogenic back pain only: Secondary | ICD-10-CM | POA: Diagnosis not present

## 2023-06-25 DIAGNOSIS — M549 Dorsalgia, unspecified: Secondary | ICD-10-CM | POA: Diagnosis not present

## 2023-06-25 MED ORDER — KETOROLAC TROMETHAMINE 60 MG/2ML IM SOLN
60.0000 mg | Freq: Once | INTRAMUSCULAR | Status: AC
  Administered 2023-06-25: 60 mg via INTRAMUSCULAR

## 2023-06-25 MED ORDER — NAPROXEN 500 MG PO TABS: 500.0000 mg | ORAL_TABLET | Freq: Two times a day (BID) | ORAL | 0 refills | Status: DC | PRN

## 2023-06-25 MED ORDER — METHYLPREDNISOLONE ACETATE 80 MG/ML IJ SUSP
80.0000 mg | Freq: Once | INTRAMUSCULAR | Status: AC
  Administered 2023-06-25: 80 mg via INTRAMUSCULAR

## 2023-06-25 NOTE — Addendum Note (Signed)
Addended by: Dierdre Searles on: 06/25/2023 02:05 PM   Modules accepted: Orders

## 2023-06-25 NOTE — Patient Instructions (Addendum)
Naproxen 500 mg 2x a day for 2 weeks  Low back HEP  3 week follow up

## 2023-06-25 NOTE — Addendum Note (Signed)
Addended by: Richardean Sale on: 06/25/2023 02:07 PM   Modules accepted: Orders

## 2023-06-26 DIAGNOSIS — N979 Female infertility, unspecified: Secondary | ICD-10-CM | POA: Diagnosis not present

## 2023-07-08 ENCOUNTER — Ambulatory Visit: Payer: 59 | Admitting: Sports Medicine

## 2023-07-24 DIAGNOSIS — N979 Female infertility, unspecified: Secondary | ICD-10-CM | POA: Diagnosis not present

## 2023-07-30 DIAGNOSIS — Z3200 Encounter for pregnancy test, result unknown: Secondary | ICD-10-CM | POA: Diagnosis not present

## 2023-07-30 DIAGNOSIS — O0901 Supervision of pregnancy with history of infertility, first trimester: Secondary | ICD-10-CM | POA: Diagnosis not present

## 2023-07-31 ENCOUNTER — Other Ambulatory Visit (HOSPITAL_COMMUNITY): Payer: Self-pay

## 2023-07-31 MED ORDER — SERTRALINE HCL 100 MG PO TABS
100.0000 mg | ORAL_TABLET | Freq: Every day | ORAL | 0 refills | Status: DC
  Filled 2023-07-31: qty 90, 90d supply, fill #0

## 2023-07-31 MED ORDER — PROGESTERONE 200 MG PO CAPS
200.0000 mg | ORAL_CAPSULE | Freq: Every day | ORAL | 2 refills | Status: DC
  Filled 2023-07-31: qty 30, 30d supply, fill #0

## 2023-08-03 DIAGNOSIS — O0901 Supervision of pregnancy with history of infertility, first trimester: Secondary | ICD-10-CM | POA: Diagnosis not present

## 2023-08-06 DIAGNOSIS — O0901 Supervision of pregnancy with history of infertility, first trimester: Secondary | ICD-10-CM | POA: Diagnosis not present

## 2023-08-06 DIAGNOSIS — O0281 Inappropriate change in quantitative human chorionic gonadotropin (hCG) in early pregnancy: Secondary | ICD-10-CM | POA: Diagnosis not present

## 2023-08-10 DIAGNOSIS — O0901 Supervision of pregnancy with history of infertility, first trimester: Secondary | ICD-10-CM | POA: Diagnosis not present

## 2023-08-10 DIAGNOSIS — O0281 Inappropriate change in quantitative human chorionic gonadotropin (hCG) in early pregnancy: Secondary | ICD-10-CM | POA: Diagnosis not present

## 2023-08-10 DIAGNOSIS — F419 Anxiety disorder, unspecified: Secondary | ICD-10-CM | POA: Diagnosis not present

## 2023-08-11 ENCOUNTER — Other Ambulatory Visit (HOSPITAL_BASED_OUTPATIENT_CLINIC_OR_DEPARTMENT_OTHER): Payer: Self-pay

## 2023-08-11 ENCOUNTER — Other Ambulatory Visit (HOSPITAL_COMMUNITY): Payer: Self-pay

## 2023-08-11 MED ORDER — SERTRALINE HCL 150 MG PO CAPS
150.0000 mg | ORAL_CAPSULE | Freq: Every day | ORAL | 6 refills | Status: DC
  Filled 2023-08-11: qty 30, 30d supply, fill #0

## 2023-08-14 DIAGNOSIS — O0281 Inappropriate change in quantitative human chorionic gonadotropin (hCG) in early pregnancy: Secondary | ICD-10-CM | POA: Diagnosis not present

## 2023-08-17 DIAGNOSIS — O0281 Inappropriate change in quantitative human chorionic gonadotropin (hCG) in early pregnancy: Secondary | ICD-10-CM | POA: Diagnosis not present

## 2023-08-20 DIAGNOSIS — F4310 Post-traumatic stress disorder, unspecified: Secondary | ICD-10-CM | POA: Diagnosis not present

## 2023-08-25 ENCOUNTER — Encounter: Payer: Self-pay | Admitting: Nurse Practitioner

## 2023-08-25 ENCOUNTER — Ambulatory Visit: Payer: 59 | Admitting: Nurse Practitioner

## 2023-08-25 VITALS — BP 110/80 | HR 65 | Temp 98.2°F | Ht 62.0 in | Wt 307.2 lb

## 2023-08-25 DIAGNOSIS — J4520 Mild intermittent asthma, uncomplicated: Secondary | ICD-10-CM

## 2023-08-25 DIAGNOSIS — Z1159 Encounter for screening for other viral diseases: Secondary | ICD-10-CM | POA: Diagnosis not present

## 2023-08-25 DIAGNOSIS — E559 Vitamin D deficiency, unspecified: Secondary | ICD-10-CM | POA: Diagnosis not present

## 2023-08-25 DIAGNOSIS — F411 Generalized anxiety disorder: Secondary | ICD-10-CM

## 2023-08-25 DIAGNOSIS — Z13220 Encounter for screening for lipoid disorders: Secondary | ICD-10-CM | POA: Diagnosis not present

## 2023-08-25 DIAGNOSIS — F332 Major depressive disorder, recurrent severe without psychotic features: Secondary | ICD-10-CM | POA: Diagnosis not present

## 2023-08-25 DIAGNOSIS — Z114 Encounter for screening for human immunodeficiency virus [HIV]: Secondary | ICD-10-CM

## 2023-08-25 DIAGNOSIS — R7303 Prediabetes: Secondary | ICD-10-CM | POA: Diagnosis not present

## 2023-08-25 LAB — COMPREHENSIVE METABOLIC PANEL
ALT: 33 U/L (ref 0–35)
AST: 23 U/L (ref 0–37)
Albumin: 4.9 g/dL (ref 3.5–5.2)
Alkaline Phosphatase: 47 U/L (ref 39–117)
BUN: 11 mg/dL (ref 6–23)
CO2: 28 meq/L (ref 19–32)
Calcium: 9.6 mg/dL (ref 8.4–10.5)
Chloride: 102 meq/L (ref 96–112)
Creatinine, Ser: 0.8 mg/dL (ref 0.40–1.20)
GFR: 93.91 mL/min (ref >60.00)
Glucose, Bld: 93 mg/dL (ref 70–99)
Potassium: 4.6 meq/L (ref 3.5–5.1)
Sodium: 139 meq/L (ref 135–145)
Total Bilirubin: 0.5 mg/dL (ref 0.2–1.2)
Total Protein: 7.6 g/dL (ref 6.0–8.3)

## 2023-08-25 LAB — LIPID PANEL
Cholesterol: 223 mg/dL — ABNORMAL HIGH (ref 0–200)
HDL: 50.1 mg/dL (ref >39.00)
LDL Cholesterol: 144 mg/dL — ABNORMAL HIGH (ref 0–99)
NonHDL: 173.09
Total CHOL/HDL Ratio: 4
Triglycerides: 145 mg/dL (ref 0.0–149.0)
VLDL: 29 mg/dL (ref 0.0–40.0)

## 2023-08-25 LAB — CBC
HCT: 40.9 % (ref 36.0–46.0)
Hemoglobin: 13.7 g/dL (ref 12.0–15.0)
MCHC: 33.5 g/dL (ref 30.0–36.0)
MCV: 89.4 fL (ref 78.0–100.0)
Platelets: 220 10*3/uL (ref 150.0–400.0)
RBC: 4.58 Mil/uL (ref 3.87–5.11)
RDW: 13.3 % (ref 11.5–15.5)
WBC: 6.6 10*3/uL (ref 4.0–10.5)

## 2023-08-25 LAB — HEMOGLOBIN A1C: Hgb A1c MFr Bld: 5.9 % (ref 4.6–6.5)

## 2023-08-25 LAB — TSH: TSH: 2.25 u[IU]/mL (ref 0.35–5.50)

## 2023-08-25 LAB — VITAMIN D 25 HYDROXY (VIT D DEFICIENCY, FRACTURES): VITD: 21.09 ng/mL — ABNORMAL LOW (ref 30.00–100.00)

## 2023-08-25 MED ORDER — ALPRAZOLAM 0.5 MG PO TABS: 0.5000 mg | ORAL_TABLET | Freq: Every evening | ORAL | 1 refills | Status: DC | PRN

## 2023-08-25 NOTE — Assessment & Plan Note (Signed)
 Failed prozac and lexapro. Recent increase of sertaline to 150mg  approx 2 weeks ago. She is currently in therapy. No Hi/Si/AVH. Referral to psychiatry

## 2023-08-25 NOTE — Patient Instructions (Signed)
Nice to see you today I will be in touch with the labs once I have them Follow up with me in 1 month, sooner if you need me 

## 2023-08-25 NOTE — Assessment & Plan Note (Addendum)
 Has tried and failed buspar, lexapro, and prozac. Currenlty on setraline 150mg  daily and in therapy. Continue medications as prescribed. Ambulatory referral to psychiatry   Refill alprazolam 0.5mg  every day PRN

## 2023-08-25 NOTE — Assessment & Plan Note (Signed)
 Hx of the same. Pending lab prior to refill prescription vitamin D

## 2023-08-25 NOTE — Progress Notes (Signed)
 New Patient Office Visit  Subjective    Patient ID: Tracy Chang, female    DOB: 26-Dec-1985  Age: 38 y.o. MRN: [id number]  CC:  Chief Complaint  Patient presents with   Establish Care    Pt complains of going through a miscarriage and mother passing away in home. States that her anxiety is worsening.   Medication Refill    Xanax , Drisdol .     HPI Tracy Chang presents to establish care   Asthma: on antihistamine and singular. States that she use albuterol  when she is sick and during the hot weather  Pre-diabetes: not on metofrmin currenlty and has GI issues outside of the medication and on the medication   Anxiety: states that she is doing therapy currently and has been dx with PTSD. States that she had a miscarriage on the 59 and then her mother passed the next day. States that she had to have a DNE and her mothers funeral was the next day. States that she had cardiomyopathy and had covid  States that she has tried proxac and lexapro. She has tried buspar that did not help    Mammogram: too young currently average risk  Dexa: too young  Pap Smear: States that she did it 2022. Was clear and HPV negative   Tdap: 2018 Flu: up to date  Covid: original and booster Pna: too young  Shingles: too young      08/25/2023   10:40 AM 12/03/2021    8:02 AM  PHQ9 SCORE ONLY  PHQ-9 Total Score 17 11       08/25/2023   10:39 AM  GAD 7 : Generalized Anxiety Score  Nervous, Anxious, on Edge 3  Control/stop worrying 3  Worry too much - different things 3  Trouble relaxing 3  Restless 0  Easily annoyed or irritable 3  Afraid - awful might happen 3  Total GAD 7 Score 18  Anxiety Difficulty Extremely difficult      Outpatient Encounter Medications as of 08/25/2023  Medication Sig   albuterol  (VENTOLIN  HFA) 108 (90 Base) MCG/ACT inhaler Inhale 1-2 puffs into the lungs every 6 (six) hours as needed for wheezing or shortness of breath.   cetirizine (ZYRTEC) 10 MG  tablet Take 10 mg by mouth daily.   montelukast  (SINGULAIR ) 10 MG tablet Take 1 tablet (10 mg total) by mouth at bedtime.   Prenatal Vit-Fe Fumarate-FA (PRENATAL MULTIVITAMIN) TABS tablet Take 1 tablet by mouth daily at 12 noon.   sertraline  (ZOLOFT ) 100 MG tablet Take 1 tablet (100 mg total) by mouth daily. (Patient taking differently: Take 150 mg by mouth daily.)   Sertraline  HCl 150 MG CAPS TAKE 1 CAPSULE BY MOUTH EVERY DAY   traZODone (DESYREL) 50 MG tablet Take 50 mg by mouth daily.   valACYclovir (VALTREX) 1000 MG tablet Take 1,000 mg by mouth as needed.   Vitamin D , Ergocalciferol , (DRISDOL ) 1.25 MG (50000 UNIT) CAPS capsule Take 1 capsule (50,000 Units total) by mouth every 7 (seven) days.   [DISCONTINUED] ALPRAZolam  (XANAX ) 0.5 MG tablet Take 0.5 mg by mouth at bedtime as needed for anxiety.   ALPRAZolam  (XANAX ) 0.5 MG tablet Take 1 tablet (0.5 mg total) by mouth at bedtime as needed for anxiety.   metFORMIN  (GLUCOPHAGE -XR) 500 MG 24 hr tablet Take 1 tablet (500 mg total) by mouth daily with breakfast. (Patient not taking: Reported on 08/25/2023)   progesterone  (PROMETRIUM ) 200 MG capsule Place 1 capsule (200 mg total) vaginally at bedtime until  12 weeks (Patient not taking: Reported on 08/25/2023)   [DISCONTINUED] benzonatate  (TESSALON ) 100 MG capsule Take 2 capsules (200 mg total) by mouth every 8 (eight) hours.   [DISCONTINUED] cyclobenzaprine  (FLEXERIL ) 10 MG tablet Take 1 tablet (10 mg total) by mouth at bedtime.   [DISCONTINUED] guaiFENesin -codeine  (ROBITUSSIN AC) 100-10 MG/5ML syrup Take 5 mLs by mouth 3 (three) times daily as needed for cough.   [DISCONTINUED] ipratropium (ATROVENT ) 0.06 % nasal spray Place 2 sprays into both nostrils 4 (four) times daily. (Patient not taking: Reported on 08/25/2023)   [DISCONTINUED] methocarbamol  (ROBAXIN ) 500 MG tablet Take 1 tablet (500 mg total) by mouth 2 (two) times daily.   [DISCONTINUED] naproxen  (NAPROSYN ) 500 MG tablet Take 1 tablet (500 mg  total) by mouth 2 (two) times daily as needed.   [DISCONTINUED] predniSONE  (DELTASONE ) 10 MG tablet Take 1 tablet (10 mg total) by mouth in the morning, at noon, and at bedtime.   [DISCONTINUED] sertraline  (ZOLOFT ) 100 MG tablet Take 1 tablet by mouth daily   No facility-administered encounter medications on file as of 08/25/2023.    Past Medical History:  Diagnosis Date   ADD (attention deficit disorder)    Anxiety    Asthma    Back pain    Complication of anesthesia    Conscious sedation-extreme agitation   Fatty liver    Gallbladder problem    Generalized anxiety disorder    Heartburn    High blood pressure    High cholesterol    Irritable bowel syndrome    Joint pain    Multiple food allergies    Post partum depression    Prediabetes    Pregnancy induced hypertension    SOB (shortness of breath)     Past Surgical History:  Procedure Laterality Date   CESAREAN SECTION N/A 08/06/2020   Procedure: CESAREAN SECTION;  Surgeon: Gorge Ade, MD;  Location: [facility] LD ORS;  Service: Obstetrics;  Laterality: N/A;   CHOLECYSTECTOMY     TONSILLECTOMY      Family History  Problem Relation Age of Onset   Cancer Mother        lymphoma   Hyperlipidemia Mother    Hypertension Mother    Heart disease Mother    Kidney disease Mother    Depression Mother    Anxiety disorder Mother    Sleep apnea Mother    Eating disorder Mother    Obesity Mother    Cancer Father        lung   Heart disease Father     Social History   Socioeconomic History   Marital status: Married    Spouse name: Levon Penning   Number of children: 1   Years of education: Not on file   Highest education level: Not on file  Occupational History   Occupation: Nurse  Tobacco Use   Smoking status: Never   Smokeless tobacco: Never  Vaping Use   Vaping status: Never Used  Substance and Sexual Activity   Alcohol use: Not Currently    Comment: SOCIAL   Drug use: Never   Sexual activity: Yes  Other  Topics Concern   Not on file  Social History Narrative   Fulltime: LPN at [employer]    Social Drivers of Health   Financial Resource Strain: Low Risk  (12/18/2022)   Received from [facility]   Overall Financial Resource Strain (CARDIA)    Difficulty of Paying Living Expenses: Not hard at all  Food Insecurity: No Food  Insecurity (12/18/2022)   Received from [facility]   Hunger Vital Sign    Worried About Running Out of Food in the Last Year: Never true    Ran Out of Food in the Last Year: Never true  Transportation Needs: No Transportation Needs (12/18/2022)   Received from [facility]   PRAPARE - Transportation    Lack of Transportation (Medical): No    Lack of Transportation (Non-Medical): No  Physical Activity: Not on file  Stress: Not on file  Social Connections: Unknown (12/10/2021)   Received from [facility], [facility]   Social Network    Social Network: Not on file  Intimate Partner Violence: Unknown (11/12/2021)   Received from [facility], [facility]   HITS    Physically Hurt: Not on file    Insult or Talk Down To: Not on file    Threaten Physical Harm: Not on file    Scream or Curse: Not on file    Review of Systems  Constitutional:  Negative for chills and fever.  Respiratory:  Negative for shortness of breath.   Cardiovascular:  Negative for chest pain (with anxiety).  Gastrointestinal:  Negative for abdominal pain, blood in stool, constipation, diarrhea and vomiting.       BM daily   Genitourinary:  Negative for dysuria and hematuria.  Neurological:  Negative for dizziness and headaches.  Psychiatric/Behavioral:  Negative for hallucinations and suicidal ideas.         Objective    BP 110/80   Pulse 65   Temp 98.2 F (36.8 C) (Oral)   Ht 5' 2 (1.575 m)   Wt (!) 307 lb 3.2 oz (139.3 kg)   SpO2 95%   BMI 56.19 kg/m   Physical Exam Vitals and nursing note reviewed.  Constitutional:      Appearance: Normal appearance.  HENT:      Right Ear: Tympanic membrane, ear canal and external ear normal.     Left Ear: Tympanic membrane, ear canal and external ear normal.     Mouth/Throat:     Mouth: Mucous membranes are moist.     Pharynx: Oropharynx is clear.  Eyes:     Extraocular Movements: Extraocular movements intact.     Pupils: Pupils are equal, round, and reactive to light.  Cardiovascular:     Rate and Rhythm: Normal rate and regular rhythm.     Pulses: Normal pulses.     Heart sounds: Normal heart sounds.  Pulmonary:     Effort: Pulmonary effort is normal.     Breath sounds: Normal breath sounds.  Musculoskeletal:     Right lower leg: No edema.     Left lower leg: No edema.  Lymphadenopathy:     Cervical: No cervical adenopathy.  Skin:    General: Skin is warm.  Neurological:     General: No focal deficit present.     Mental Status: She is alert.     Deep Tendon Reflexes:     Reflex Scores:      Bicep reflexes are 2+ on the right side and 2+ on the left side.      Patellar reflexes are 2+ on the right side and 2+ on the left side.    Comments: Bilateral upper and lower extremity strength 5/5  Psychiatric:        Mood and Affect: Mood normal.        Behavior: Behavior normal.        Thought Content: Thought content normal.  Judgment: Judgment normal.         Assessment & Plan:   Problem List Items Addressed This Visit       Respiratory   Asthma   Well controlled. She is on antihistamine, singular, albuterol  as needed. Stable. Continue medications as prescribed         Other   Morbid obesity (HCC)   Hx of pre-diabetes. 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 Recent increase of sertaline to 150mg  approx 2 weeks ago. She is currently in therapy. No Hi/Si/AVH. Referral to psychiatry       Relevant Medications    traZODone (DESYREL) 50 MG tablet   ALPRAZolam  (XANAX ) 0.5 MG tablet   Other Relevant Orders   Ambulatory referral to Psychiatry   CBC   Comprehensive metabolic panel   TSH   GAD (generalized anxiety disorder)   Has tried and failed buspar, lexapro, and prozac. Currenlty on setraline 150mg  daily and in therapy. Continue medications as prescribed. Ambulatory referral to psychiatry   Refill alprazolam  0.5mg  every day PRN       Relevant Medications   traZODone (DESYREL) 50 MG tablet   ALPRAZolam  (XANAX ) 0.5 MG tablet   Other Relevant Orders   Ambulatory referral to Psychiatry   CBC   Comprehensive metabolic panel   TSH   Prediabetes   Was on metformin  in the past. Has GI issues with it. Currently not on any medication  Pending A1C      Relevant Orders   CBC   Comprehensive metabolic panel   Hemoglobin A1c   Vitamin D  deficiency   Hx of the same. Pending lab prior to refill prescription vitamin D        Relevant Orders   VITAMIN D  25 Hydroxy (Vit-D Deficiency, Fractures)   Other Visit Diagnoses       Encounter for hepatitis C screening test for low risk patient       Relevant Orders   Hepatitis C Antibody     Encounter for screening for HIV       Relevant Orders   HIV antibody (with reflex)     Screening for lipid disorders       Relevant Orders   Lipid panel       Return in about 4 weeks (around 09/22/2023) for MDD/GAD recheck .   Adina Crandall, NP

## 2023-08-25 NOTE — Assessment & Plan Note (Signed)
 Well controlled. She is on antihistamine, singular, albuterol as needed. Stable. Continue medications as prescribed

## 2023-08-25 NOTE — Assessment & Plan Note (Signed)
 Hx of pre-diabetes. Pending A1C, TSH, Lipid panel

## 2023-08-25 NOTE — Assessment & Plan Note (Signed)
 Was on metformin in the past. Has GI issues with it. Currently not on any medication  Pending A1C

## 2023-08-26 LAB — HEPATITIS C ANTIBODY: Hepatitis C Ab: NONREACTIVE

## 2023-08-26 LAB — HIV ANTIBODY (ROUTINE TESTING W REFLEX): HIV 1&2 Ab, 4th Generation: NONREACTIVE

## 2023-08-27 ENCOUNTER — Other Ambulatory Visit: Payer: Self-pay | Admitting: Nurse Practitioner

## 2023-08-27 DIAGNOSIS — E559 Vitamin D deficiency, unspecified: Secondary | ICD-10-CM

## 2023-08-27 DIAGNOSIS — F4310 Post-traumatic stress disorder, unspecified: Secondary | ICD-10-CM | POA: Diagnosis not present

## 2023-08-27 MED ORDER — VITAMIN D (ERGOCALCIFEROL) 1.25 MG (50000 UNIT) PO CAPS: 50000.0000 [IU] | ORAL_CAPSULE | ORAL | 0 refills | Status: DC

## 2023-09-08 ENCOUNTER — Encounter: Payer: Self-pay | Admitting: General Practice

## 2023-09-08 ENCOUNTER — Ambulatory Visit: Payer: 59 | Admitting: General Practice

## 2023-09-08 VITALS — BP 124/72 | HR 78 | Temp 97.5°F | Ht 62.0 in | Wt 306.8 lb

## 2023-09-08 DIAGNOSIS — R059 Cough, unspecified: Secondary | ICD-10-CM | POA: Diagnosis not present

## 2023-09-08 DIAGNOSIS — B9689 Other specified bacterial agents as the cause of diseases classified elsewhere: Secondary | ICD-10-CM | POA: Diagnosis not present

## 2023-09-08 DIAGNOSIS — R0981 Nasal congestion: Secondary | ICD-10-CM

## 2023-09-08 DIAGNOSIS — H938X3 Other specified disorders of ear, bilateral: Secondary | ICD-10-CM | POA: Insufficient documentation

## 2023-09-08 DIAGNOSIS — J208 Acute bronchitis due to other specified organisms: Secondary | ICD-10-CM

## 2023-09-08 LAB — POCT INFLUENZA A/B
Influenza A, POC: NEGATIVE
Influenza B, POC: NEGATIVE

## 2023-09-08 LAB — POC COVID19 BINAXNOW: SARS Coronavirus 2 Ag: NEGATIVE

## 2023-09-08 MED ORDER — AZITHROMYCIN 250 MG PO TABS: ORAL_TABLET | ORAL | 0 refills | Status: AC

## 2023-09-08 MED ORDER — PREDNISONE 10 MG (21) PO TBPK: ORAL_TABLET | ORAL | 0 refills | Status: DC

## 2023-09-08 MED ORDER — FLUTICASONE PROPIONATE 50 MCG/ACT NA SUSP: 2.0000 | Freq: Every day | NASAL | 0 refills | Status: DC

## 2023-09-08 NOTE — Progress Notes (Signed)
Established Patient Office Visit  Subjective   Patient ID: Tracy Chang, female    DOB: 01/02/1986  Age: 38 y.o. MRN: [id number]  Chief Complaint  Patient presents with   Cough   Nasal Congestion    Scratchy throat   Ear Fullness    Feels like they are going to "burst". Majority of symptoms started on Sunday    Cough Pertinent negatives include no chest pain, chills, ear pain, fever, headaches or sore throat.  Ear Fullness  Associated symptoms include coughing. Pertinent negatives include no ear discharge, headaches, sore throat or vomiting.    Tracy Chang is a 38 year old female, patient of Audria Nine, NP, with past medical history of Asthma, morbid obesity, anxiety presents today for an acute visit to discuss cough, nasal congestion, ear fullness.   Symptom onset was on 09/06/23 with runny nose and then nasal congestion, scratchy throat, ear fullness and chest congestion. She has a productive cough and did cough up green phlegm this morning. She denies any fever, chills, body aches. She has a history of asthma and does have albuterol at home and she has not had to use it.   She has not been exposed to any known covid/flu. No hx of smoking. Has had the flu vaccine this season.    Patient Active Problem List   Diagnosis Date Noted   Cough 09/08/2023   Acute bacterial bronchitis 09/08/2023   Sensation of fullness in both ears 09/08/2023   Severe episode of recurrent major depressive disorder, without psychotic features (HCC) 08/25/2023   GAD (generalized anxiety disorder) 08/25/2023   Prediabetes 08/25/2023   Vitamin D deficiency 08/25/2023   Anemia associated with acute blood loss EBL 724 08/07/2020   Anxiety 08/06/2020   Asthma 08/06/2020   Failure to progress in labor 08/06/2020   Status post primary low transverse cesarean section 12/27 08/06/2020   Postpartum care following cesarean delivery 12/27 08/06/2020   Morbid obesity (HCC) 08/06/2020   Gestational  hypertension 08/05/2020   Past Medical History:  Diagnosis Date   ADD (attention deficit disorder)    Anxiety    Asthma    Back pain    Complication of anesthesia    Conscious sedation-extreme agitation   Fatty liver    Gallbladder problem    Generalized anxiety disorder    Heartburn    High blood pressure    High cholesterol    Irritable bowel syndrome    Joint pain    Multiple food allergies    Post partum depression    Prediabetes    Pregnancy induced hypertension    SOB (shortness of breath)    Allergies  Allergen Reactions   Augmentin [Amoxicillin-Pot Clavulanate] Hives    States that she can take amoxicillin   Ceclor [Cefaclor] Hives   Demerol [Meperidine] Hives   Meloxicam Swelling    Angioedema   Latex    Versed [Midazolam]     Panic attack while on versed   Compazine [Prochlorperazine] Anxiety         08/25/2023   10:40 AM 12/03/2021    8:02 AM  Depression screen PHQ 2/9  Decreased Interest 3 2  Down, Depressed, Hopeless 3 1  PHQ - 2 Score 6 3  Altered sleeping 3 2  Tired, decreased energy 3 3  Change in appetite 3 2  Feeling bad or failure about yourself  1 1  Trouble concentrating 1 0  Moving slowly or fidgety/restless 0 0  Suicidal thoughts 0  0  PHQ-9 Score 17 11  Difficult doing work/chores Very difficult Somewhat difficult       08/25/2023   10:39 AM  GAD 7 : Generalized Anxiety Score  Nervous, Anxious, on Edge 3  Control/stop worrying 3  Worry too much - different things 3  Trouble relaxing 3  Restless 0  Easily annoyed or irritable 3  Afraid - awful might happen 3  Total GAD 7 Score 18  Anxiety Difficulty Extremely difficult      Review of Systems  Constitutional:  Negative for chills, fever and malaise/fatigue.  HENT:  Positive for congestion and sinus pain. Negative for ear discharge, ear pain and sore throat.        Ear fullness and discomfort  Respiratory:  Positive for cough.   Cardiovascular:  Negative for chest pain.        Chest tightness from cough.  Gastrointestinal:  Negative for nausea and vomiting.  Genitourinary:  Negative for dysuria, frequency and urgency.  Neurological:  Negative for dizziness and headaches.      Objective:     BP 124/72   Pulse 78   Temp (!) 97.5 F (36.4 C) (Oral)   Ht 5\' 2"  (1.575 m)   Wt (!) 306 lb 12.8 oz (139.2 kg)   SpO2 96%   BMI 56.11 kg/m  BP Readings from Last 3 Encounters:  09/08/23 124/72  08/25/23 110/80  10/18/22 (!) 152/78   Wt Readings from Last 3 Encounters:  09/08/23 (!) 306 lb 12.8 oz (139.2 kg)  08/25/23 (!) 307 lb 3.2 oz (139.3 kg)  06/25/23 (!) 301 lb (136.5 kg)      Physical Exam Vitals and nursing note reviewed.  Constitutional:      Appearance: Normal appearance.  HENT:     Right Ear: Ear canal and external ear normal. A middle ear effusion is present.     Left Ear: Ear canal and external ear normal. A middle ear effusion is present.     Nose:     Right Sinus: Maxillary sinus tenderness and frontal sinus tenderness present.     Left Sinus: Maxillary sinus tenderness and frontal sinus tenderness present.     Mouth/Throat:     Pharynx: Posterior oropharyngeal erythema present. No oropharyngeal exudate.  Cardiovascular:     Rate and Rhythm: Normal rate and regular rhythm.     Pulses: Normal pulses.     Heart sounds: Normal heart sounds.  Pulmonary:     Effort: Pulmonary effort is normal.     Breath sounds: Normal breath sounds.  Neurological:     Mental Status: She is alert and oriented to person, place, and time.  Psychiatric:        Mood and Affect: Mood normal.        Behavior: Behavior normal.        Thought Content: Thought content normal.        Judgment: Judgment normal.      Results for orders placed or performed in visit on 09/08/23  POC COVID-19 BinaxNow  Result Value Ref Range   SARS Coronavirus 2 Ag Negative Negative  POCT Influenza A/B  Result Value Ref Range   Influenza A, POC Negative Negative    Influenza B, POC Negative Negative       The ASCVD Risk score (Arnett DK, et al., 2019) failed to calculate for the following reasons:   The 2019 ASCVD risk score is only valid for ages 25 to 71    Assessment &  Plan:  Cough, unspecified type Assessment & Plan: Recommend rest, hydration.   Start prednisone dose pack.  Use albuterol as needed.  Orders: -     POC COVID-19 BinaxNow -     POCT Influenza A/B  Congestion of nasal sinus -     POC COVID-19 BinaxNow -     POCT Influenza A/B  Acute bacterial bronchitis Assessment & Plan: Symptoms suggestive of bronchitis.  Given presentation today, will treat with prednisone and z-pack.   Orders: -     predniSONE; Follow package instructions  Dispense: 21 tablet; Refill: 0 -     Azithromycin; Take 2 tablets on day 1, then 1 tablet daily on days 2 through 5  Dispense: 6 tablet; Refill: 0  Sensation of fullness in both ears Assessment & Plan: No sign of otitis media.  Fluid noticed on exam.   Sent rx for flonase  Orders: -     Fluticasone Propionate; Place 2 sprays into both nostrils daily.  Dispense: 16 g; Refill: 0     Return if symptoms worsen or fail to improve.    Modesto Charon, NP

## 2023-09-08 NOTE — Assessment & Plan Note (Signed)
Recommend rest, hydration.   Start prednisone dose pack.  Use albuterol as needed.

## 2023-09-08 NOTE — Assessment & Plan Note (Signed)
No sign of otitis media.  Fluid noticed on exam.   Sent rx for flonase

## 2023-09-08 NOTE — Assessment & Plan Note (Signed)
Symptoms suggestive of bronchitis.  Given presentation today, will treat with prednisone and z-pack.

## 2023-09-08 NOTE — Patient Instructions (Addendum)
Start prednisone.   Start Azithromycin antibiotics for infection. Take 2 tablets by mouth today, then 1 tablet daily for 4 additional days.  You can try a few things over the counter to help with your symptoms including:  Cough: Delsym or Robitussin (get the off brand, works just as well) Chest Congestion: Mucinex (plain) Nasal Congestion/Ear Pressure/Sinus Pressure: Try using Flonase (fluticasone) nasal spray. Instill 1 spray in each nostril twice daily. This can be purchased over the counter. Body aches, fevers, headache: Ibuprofen (not to exceed 2400 mg in 24 hours) or Acetaminophen-Tylenol (not to exceed 3000 mg in 24 hours) Runny Nose/Throat Drainage/Sneezing/Itchy or Watery Eyes: An antihistamine such as Zyrtec, Claritin, Xyzal, Allegra  Schedule follow up with PCP symptoms worsen or fail to improve.

## 2023-09-09 DIAGNOSIS — F4310 Post-traumatic stress disorder, unspecified: Secondary | ICD-10-CM | POA: Diagnosis not present

## 2023-09-15 DIAGNOSIS — F4310 Post-traumatic stress disorder, unspecified: Secondary | ICD-10-CM | POA: Diagnosis not present

## 2023-09-22 DIAGNOSIS — F4310 Post-traumatic stress disorder, unspecified: Secondary | ICD-10-CM | POA: Diagnosis not present

## 2023-09-29 DIAGNOSIS — F4310 Post-traumatic stress disorder, unspecified: Secondary | ICD-10-CM | POA: Diagnosis not present

## 2023-10-01 ENCOUNTER — Encounter: Payer: Self-pay | Admitting: Nurse Practitioner

## 2023-10-01 DIAGNOSIS — E559 Vitamin D deficiency, unspecified: Secondary | ICD-10-CM

## 2023-10-05 ENCOUNTER — Other Ambulatory Visit: Payer: Self-pay | Admitting: General Practice

## 2023-10-05 DIAGNOSIS — H938X3 Other specified disorders of ear, bilateral: Secondary | ICD-10-CM

## 2023-10-05 MED ORDER — VITAMIN D (ERGOCALCIFEROL) 1.25 MG (50000 UNIT) PO CAPS: 50000.0000 [IU] | ORAL_CAPSULE | ORAL | 0 refills | Status: DC

## 2023-10-05 NOTE — Telephone Encounter (Signed)
 Refill sent in

## 2023-10-06 DIAGNOSIS — F4310 Post-traumatic stress disorder, unspecified: Secondary | ICD-10-CM | POA: Diagnosis not present

## 2023-10-07 ENCOUNTER — Other Ambulatory Visit (HOSPITAL_COMMUNITY): Payer: Self-pay

## 2023-10-07 ENCOUNTER — Other Ambulatory Visit (HOSPITAL_BASED_OUTPATIENT_CLINIC_OR_DEPARTMENT_OTHER): Payer: Self-pay

## 2023-10-07 MED ORDER — SERTRALINE HCL 100 MG PO TABS
150.0000 mg | ORAL_TABLET | Freq: Every day | ORAL | 0 refills | Status: DC
  Filled 2023-10-07 (×2): qty 90, 60d supply, fill #0

## 2023-10-08 ENCOUNTER — Ambulatory Visit: Payer: 59 | Admitting: General Practice

## 2023-10-09 ENCOUNTER — Ambulatory Visit: Payer: 59 | Admitting: Nurse Practitioner

## 2023-10-09 NOTE — Telephone Encounter (Signed)
Ok to send notes as requested

## 2023-10-09 NOTE — Telephone Encounter (Signed)
 Ok by me

## 2023-10-10 ENCOUNTER — Encounter: Payer: Self-pay | Admitting: Emergency Medicine

## 2023-10-10 ENCOUNTER — Ambulatory Visit
Admission: EM | Admit: 2023-10-10 | Discharge: 2023-10-10 | Disposition: A | Discharge status: Home / Self Care | Attending: Emergency Medicine | Admitting: Emergency Medicine

## 2023-10-10 ENCOUNTER — Other Ambulatory Visit: Payer: Self-pay

## 2023-10-10 DIAGNOSIS — J4521 Mild intermittent asthma with (acute) exacerbation: Secondary | ICD-10-CM

## 2023-10-10 MED ORDER — METHYLPREDNISOLONE ACETATE 80 MG/ML IJ SUSP
60.0000 mg | Freq: Once | INTRAMUSCULAR | Status: AC
  Administered 2023-10-10: 60 mg via INTRAMUSCULAR

## 2023-10-10 MED ORDER — AZITHROMYCIN 250 MG PO TABS: 250.0000 mg | ORAL_TABLET | Freq: Every day | ORAL | 0 refills | Status: DC

## 2023-10-10 MED ORDER — PREDNISONE 10 MG (21) PO TBPK: ORAL_TABLET | Freq: Every day | ORAL | 0 refills | Status: DC

## 2023-10-10 NOTE — ED Provider Notes (Signed)
 Renaldo Fiddler    CSN: [id number] Arrival date & time: 10/10/23  1018      History   Chief Complaint Chief Complaint  Patient presents with   Cough    HPI Tracy Chang is a 38 y.o. female.   Patient presents for evaluation of postnasal drip and nonproductive cough present for 7 days, over the past 3 days cough has become productive with green-yellow sputum, experiencing shortness of breath at rest and wheezing.  Sore throat only experienced from coughing drainage.  Denies presence of fever.  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  Has attempted use of Singulair and Mucinex.  Tolerating food and liquids.    Past Medical History:  Diagnosis Date   ADD (attention deficit disorder)    Anxiety    Asthma    Back pain    Complication of anesthesia    Conscious sedation-extreme agitation   Fatty liver    Gallbladder problem    Generalized anxiety disorder    Heartburn    High blood pressure    High cholesterol    Irritable bowel syndrome    Joint pain    Multiple food allergies    Post partum depression    Prediabetes    Pregnancy induced hypertension    SOB (shortness of breath)     Patient Active Problem List   Diagnosis Date Noted   Cough 09/08/2023   Acute bacterial bronchitis 09/08/2023   Sensation of fullness in both ears 09/08/2023   Severe episode of recurrent major depressive disorder, without psychotic features (HCC) 08/25/2023   GAD (generalized anxiety disorder) 08/25/2023   Prediabetes 08/25/2023   Vitamin D deficiency 08/25/2023   Anemia associated with acute blood loss EBL 724 08/07/2020   Anxiety 08/06/2020   Asthma 08/06/2020   Failure to progress in labor 08/06/2020   Status post primary low transverse cesarean section 12/27 08/06/2020   Postpartum care following cesarean delivery 12/27 08/06/2020   Morbid obesity (HCC) 08/06/2020   Gestational hypertension 08/05/2020    Past Surgical History:  Procedure  Laterality Date   CESAREAN SECTION N/A 08/06/2020   Procedure: CESAREAN SECTION;  Surgeon: Olivia Mackie, MD;  Location: [facility] ORS;  Service: Obstetrics;  Laterality: N/A;   CHOLECYSTECTOMY     TONSILLECTOMY      OB History     Gravida  2   Para  1   Term  1   Preterm      AB  1   Living  1      SAB      IAB      Ectopic      Multiple  0   Live Births  1            Home Medications    Prior to Admission medications   Medication Sig Start Date End Date Taking? Authorizing Provider  azithromycin (ZITHROMAX) 250 MG tablet Take 1 tablet (250 mg total) by mouth daily. Take first 2 tablets together, then 1 every day until finished. 10/10/23  Yes Captain Blucher R, NP  predniSONE (STERAPRED UNI-PAK 21 TAB) 10 MG (21) TBPK tablet Take by mouth daily. Take 6 tabs by mouth daily  for 1 days, then 5 tabs for 1 days, then 4 tabs for 1 days, then 3 tabs for 1 days, 2 tabs for 1 days, then 1 tab by mouth daily for 1 days 10/10/23  Yes Fatou Dunnigan, Elita Boone, NP  albuterol (VENTOLIN HFA)  108 (90 Base) MCG/ACT inhaler Inhale 1-2 puffs into the lungs every 6 (six) hours as needed for wheezing or shortness of breath.    [provider]  ALPRAZolam Prudy Feeler) 0.5 MG tablet Take 1 tablet (0.5 mg total) by mouth at bedtime as needed for anxiety. 08/25/23   Eden Emms, NP  cetirizine (ZYRTEC) 10 MG tablet Take 10 mg by mouth daily.    [provider]  fluticasone (FLONASE) 50 MCG/ACT nasal spray SPRAY 2 SPRAYS INTO EACH NOSTRIL EVERY DAY 10/05/23   Eden Emms, NP  metFORMIN (GLUCOPHAGE-XR) 500 MG 24 hr tablet Take 1 tablet (500 mg total) by mouth daily with breakfast. 05/13/22   Langston Reusing, MD  montelukast (SINGULAIR) 10 MG tablet Take 1 tablet (10 mg total) by mouth at bedtime. 06/02/23     Prenatal Vit-Fe Fumarate-FA (PRENATAL MULTIVITAMIN) TABS tablet Take 1 tablet by mouth daily at 12 noon.    [provider]  progesterone (PROMETRIUM) 200 MG capsule  Place 1 capsule (200 mg total) vaginally at bedtime until 12 weeks 07/31/23     sertraline (ZOLOFT) 100 MG tablet Take 1 tablet (100 mg total) by mouth daily. Patient taking differently: Take 150 mg by mouth daily. 10/11/20     sertraline (ZOLOFT) 100 MG tablet Take 1.5 tablets (150 mg total) by mouth daily. 10/07/23     Sertraline HCl 150 MG CAPS TAKE 1 CAPSULE BY MOUTH EVERY DAY 08/11/23     traZODone (DESYREL) 50 MG tablet Take 50 mg by mouth daily. 08/11/23   [provider]  valACYclovir (VALTREX) 1000 MG tablet Take 1,000 mg by mouth as needed.    [provider]  Vitamin D, Ergocalciferol, (DRISDOL) 1.25 MG (50000 UNIT) CAPS capsule Take 1 capsule (50,000 Units total) by mouth every 7 (seven) days. 10/05/23   Eden Emms, NP    Family History Family History  Problem Relation Age of Onset   Cancer Mother        lymphoma   Hyperlipidemia Mother    Hypertension Mother    Heart disease Mother    Kidney disease Mother    Depression Mother    Anxiety disorder Mother    Sleep apnea Mother    Eating disorder Mother    Obesity Mother    Cancer Father        lung   Heart disease Father     Social History Social History   Tobacco Use   Smoking status: Never   Smokeless tobacco: Never  Vaping Use   Vaping status: Never Used  Substance Use Topics   Alcohol use: Not Currently    Comment: SOCIAL   Drug use: Never     Allergies   Augmentin [amoxicillin-pot clavulanate], Ceclor [cefaclor], Demerol [meperidine], Meloxicam, Latex, Versed [midazolam], and Compazine [prochlorperazine]   Review of Systems Review of Systems   Physical Exam Triage Vital Signs ED Triage Vitals  Encounter Vitals Group     BP 10/10/23 1103 117/71     Systolic BP Percentile --      Diastolic BP Percentile --      Pulse Rate 10/10/23 1103 75     Resp 10/10/23 1103 18     Temp 10/10/23 1103 98.3 F (36.8 C)     Temp Source 10/10/23 1103 Oral     SpO2 10/10/23 1103 92 %      Weight --      Height --      Head Circumference --  Peak Flow --      Pain Score 10/10/23 1105 0     Pain Loc --      Pain Education --      Exclude from Growth Chart --    No data found.  Updated Vital Signs BP 117/71 (BP Location: Left Arm)   Pulse 75   Temp 98.3 F (36.8 C) (Oral)   Resp 18   LMP 10/08/2023   SpO2 92%   Visual Acuity Right Eye Distance:   Left Eye Distance:   Bilateral Distance:    Right Eye Near:   Left Eye Near:    Bilateral Near:     Physical Exam Constitutional:      Appearance: Normal appearance.  HENT:     Head: Normocephalic.     Right Ear: Tympanic membrane, ear canal and external ear normal.     Left Ear: Tympanic membrane, ear canal and external ear normal.     Nose: Congestion present. No rhinorrhea.     Mouth/Throat:     Pharynx: No oropharyngeal exudate or posterior oropharyngeal erythema.  Eyes:     Extraocular Movements: Extraocular movements intact.  Cardiovascular:     Rate and Rhythm: Normal rate and regular rhythm.     Pulses: Normal pulses.     Heart sounds: Normal heart sounds.  Pulmonary:     Effort: Pulmonary effort is normal.     Comments: Expiratory wheezing present to the bilateral upper lobes, remaining lobes clear Skin:    General: Skin is warm and dry.  Neurological:     Mental Status: She is alert and oriented to person, place, and time. Mental status is at baseline.      UC Treatments / Results  Labs (all labs ordered are listed, but only abnormal results are displayed) Labs Reviewed  POC COVID19/FLU A&B COMBO    EKG   Radiology No results found.  Procedures Procedures (including critical care time)  Medications Ordered in UC Medications  methylPREDNISolone acetate (DEPO-MEDROL) injection 60 mg (60 mg Intramuscular Given 10/10/23 1138)    Initial Impression / Assessment and Plan / UC Course  I have reviewed the triage vital signs and the nursing notes.  Pertinent labs & imaging  results that were available during my care of the patient were reviewed by me and considered in my medical decision making (see chart for details).  Mild intermittent asthma with acute exacerbation  Vitals are stable, patient in no signs of distress nontoxic-appearing, O2 saturation 92% on room air, wheezing heard to auscultation, stable for outpatient management, COVID and flu test negative, discussed findings, azithromycin prescribed prophylactically and methylprednisolone IM given with prednisone prescribed for home use, recommended additional supportive measures with follow-up as needed   Final Clinical Impressions(s) / UC Diagnoses   Final diagnoses:  Mild intermittent asthma with acute exacerbation     Discharge Instructions      \Today symptoms are most likely an asthmatic flare due to weather change however as they have persisted for 7 days we will prophylactically provide antibiotic for bacterial coverage  COVID and flu  Take azithromycin as directed  You have been given an injection of methylprednisolone to help open and relax the airway should start this evening within the hour  Starting tomorrow take oral prednisone as You can take Tylenol and/or Ibuprofen as needed for fever reduction and pain relief.   For cough: honey 1/2 to 1 teaspoon (you can dilute the honey in water or another fluid).  You  can also use guaifenesin and dextromethorphan for cough. You can use a humidifier for chest congestion and cough.  If you don't have a humidifier, you can sit in the bathroom with the hot shower running.      For sore throat: try warm salt water gargles, cepacol lozenges, throat spray, warm tea or water with lemon/honey, popsicles or ice, or OTC cold relief medicine for throat discomfort.   For congestion: take a daily anti-histamine like Zyrtec, Claritin, and a oral decongestant, such as pseudoephedrine.  You can also use Flonase 1-2 sprays in each nostril daily.   It is  important to stay hydrated: drink plenty of fluids (water, gatorade/powerade/pedialyte, juices, or teas) to keep your throat moisturized and help further relieve irritation/discomfort.    ED Prescriptions     Medication Sig Dispense Auth. Provider   azithromycin (ZITHROMAX) 250 MG tablet Take 1 tablet (250 mg total) by mouth daily. Take first 2 tablets together, then 1 every day until finished. 6 tablet Serenity Fortner R, NP   predniSONE (STERAPRED UNI-PAK 21 TAB) 10 MG (21) TBPK tablet Take by mouth daily. Take 6 tabs by mouth daily  for 1 days, then 5 tabs for 1 days, then 4 tabs for 1 days, then 3 tabs for 1 days, 2 tabs for 1 days, then 1 tab by mouth daily for 1 days 21 tablet Orry Sigl, Elita Boone, NP      PDMP not reviewed this encounter.   Valinda Hoar, [place] 10/10/23 1156

## 2023-10-10 NOTE — Discharge Instructions (Signed)
\  Today symptoms are most likely an asthmatic flare due to weather change however as they have persisted for 7 days we will prophylactically provide antibiotic for bacterial coverage  COVID and flu  Take azithromycin as directed  You have been given an injection of methylprednisolone to help open and relax the airway should start this evening within the hour  Starting tomorrow take oral prednisone as You can take Tylenol and/or Ibuprofen as needed for fever reduction and pain relief.   For cough: honey 1/2 to 1 teaspoon (you can dilute the honey in water or another fluid).  You can also use guaifenesin and dextromethorphan for cough. You can use a humidifier for chest congestion and cough.  If you don't have a humidifier, you can sit in the bathroom with the hot shower running.      For sore throat: try warm salt water gargles, cepacol lozenges, throat spray, warm tea or water with lemon/honey, popsicles or ice, or OTC cold relief medicine for throat discomfort.   For congestion: take a daily anti-histamine like Zyrtec, Claritin, and a oral decongestant, such as pseudoephedrine.  You can also use Flonase 1-2 sprays in each nostril daily.   It is important to stay hydrated: drink plenty of fluids (water, gatorade/powerade/pedialyte, juices, or teas) to keep your throat moisturized and help further relieve irritation/discomfort.

## 2023-10-10 NOTE — ED Triage Notes (Signed)
 Patient presents to [facility] fo evaluation of cough, nasal congestion, shortness of breath, bilateral ear fullness.  Patient states this is Day 3.

## 2023-10-12 ENCOUNTER — Encounter: Payer: Self-pay | Admitting: Nurse Practitioner

## 2023-10-12 ENCOUNTER — Other Ambulatory Visit (HOSPITAL_COMMUNITY): Payer: Self-pay

## 2023-10-12 MED ORDER — WEGOVY 0.25 MG/0.5ML ~~LOC~~ SOAJ
0.5000 mL | SUBCUTANEOUS | 0 refills | Status: DC
  Filled 2023-10-12: qty 2, 28d supply, fill #0

## 2023-10-13 ENCOUNTER — Encounter: Payer: Self-pay | Admitting: Certified Nurse Midwife

## 2023-10-13 ENCOUNTER — Other Ambulatory Visit (HOSPITAL_COMMUNITY): Payer: Self-pay

## 2023-10-13 DIAGNOSIS — F4310 Post-traumatic stress disorder, unspecified: Secondary | ICD-10-CM | POA: Diagnosis not present

## 2023-10-16 ENCOUNTER — Ambulatory Visit: Payer: 59 | Admitting: Nurse Practitioner

## 2023-10-16 ENCOUNTER — Encounter: Payer: Self-pay | Admitting: Nurse Practitioner

## 2023-10-16 VITALS — BP 110/68 | HR 67 | Temp 97.9°F | Ht 62.0 in | Wt 312.8 lb

## 2023-10-16 DIAGNOSIS — J4520 Mild intermittent asthma, uncomplicated: Secondary | ICD-10-CM

## 2023-10-16 DIAGNOSIS — E559 Vitamin D deficiency, unspecified: Secondary | ICD-10-CM | POA: Diagnosis not present

## 2023-10-16 DIAGNOSIS — E782 Mixed hyperlipidemia: Secondary | ICD-10-CM | POA: Diagnosis not present

## 2023-10-16 DIAGNOSIS — F411 Generalized anxiety disorder: Secondary | ICD-10-CM

## 2023-10-16 LAB — VITAMIN D 25 HYDROXY (VIT D DEFICIENCY, FRACTURES): VITD: 23.09 ng/mL — ABNORMAL LOW (ref 30.00–100.00)

## 2023-10-16 NOTE — Assessment & Plan Note (Addendum)
 Most recent labs reviewed. Pt does not want to consider statins at this time. She will begin taking fish oil and continue to work on dietary and lifestyle modifications.  I evaluated patient, was consulted regarding treatment, and agree with assessment and plan per Denice Bors RN, FNP Student   Audria Nine, DNP, AGNP-C

## 2023-10-16 NOTE — Progress Notes (Signed)
 Established Patient Office Visit  Subjective   Patient ID: Tracy Chang, female    DOB: November 23, 1985  Age: 38 y.o. MRN: [id number]  Chief Complaint  Patient presents with   Follow-up    Vitamin D.     HPI  MDD/GAD: patient was seen by me on 08/25/2023 and had a arecent increase in her sertraline to 150mg  daily. She had tried and failed several agents. She is currently enrolled in therapy. She was also referred to psychiatry. She is here today for a follow up. She was given some alprazolam to use as needed. States that she is getting irritable that disrupts her life. She does have appt with psych on 03/20. She is using the aplrazolam just a few times a week   Vitamin D: her most recent vitamin D level was 21. She was started on prescription vitmain D medication once a week. She should be finishing up her second course of a 4 week round or the vitamin D 50,000. States that she has missed two weeks worth of doses in the past month.   Asthma exacerbation: she was seen on 10/10/2023 and was started on azithromycin and steroids. Took the last dose of steroids today. She is feeling better. States that she is still having some shob and PND.   HLD: she does not want to go on a statin. She is starting to take compounded semaglutide and will  be joining weight watchers program also    Review of Systems  Constitutional:  Negative for chills and fever.  Respiratory:  Negative for shortness of breath.   Cardiovascular:  Negative for chest pain and leg swelling.  Gastrointestinal:  Negative for abdominal pain, blood in stool, constipation, diarrhea, nausea and vomiting.  Genitourinary:  Negative for dysuria and hematuria.  Neurological:  Positive for dizziness. Negative for tingling and headaches.  Psychiatric/Behavioral:  Negative for hallucinations and suicidal ideas.       Objective:     BP 110/68   Pulse 67   Temp 97.9 F (36.6 C) (Oral)   Ht 5\' 2"  (1.575 m)   Wt (!) 312 lb 12.8  oz (141.9 kg)   LMP 10/08/2023 (Exact Date)   SpO2 95%   BMI 57.21 kg/m  BP Readings from Last 3 Encounters:  10/16/23 110/68  10/10/23 117/71  09/08/23 124/72   Wt Readings from Last 3 Encounters:  10/16/23 (!) 312 lb 12.8 oz (141.9 kg)  09/08/23 (!) 306 lb 12.8 oz (139.2 kg)  08/25/23 (!) 307 lb 3.2 oz (139.3 kg)   SpO2 Readings from Last 3 Encounters:  10/16/23 95%  10/10/23 92%  09/08/23 96%      Physical Exam Vitals and nursing note reviewed.  Constitutional:      Appearance: Normal appearance.  Cardiovascular:     Rate and Rhythm: Normal rate and regular rhythm.     Heart sounds: Normal heart sounds.  Pulmonary:     Effort: Pulmonary effort is normal.     Breath sounds: Normal breath sounds.  Neurological:     Mental Status: She is alert.      Results for orders placed or performed in visit on 10/16/23  VITAMIN D 25 Hydroxy (Vit-D Deficiency, Fractures)  Result Value Ref Range   VITD 23.09 (L) 30.00 - 100.00 ng/mL      The ASCVD Risk score (Arnett DK, et al., 2019) failed to calculate for the following reasons:   The 2019 ASCVD risk score is only valid for ages 36  to 37    Assessment & Plan:   Problem List Items Addressed This Visit       Respiratory   Asthma   Continue montelukast 10mg  PO daily, and albuterol 108 mcg/act MDI PRN.   I evaluated patient, was consulted regarding treatment, and agree with assessment and plan per Denice Bors RN, FNP Student   Audria Nine, DNP, AGNP-C         Other   GAD (generalized anxiety disorder)   Continue sertraline 150 mg daily and alprazolam 0.5 mg PRN. While improved on medication, she remains uncontrolled with symptoms. Given her upcoming appointment with psychiatry on 3/20 will defer further management to their expertise. She will continue with weekly talk therapy as well.       Vitamin D deficiency - Primary   Labs pending. If vitamin D competent then will consider transitioning into OTC  supplementation.   I evaluated patient, was consulted regarding treatment, and agree with assessment and plan per Denice Bors RN, FNP Student   Audria Nine, DNP, AGNP-C       Relevant Orders   VITAMIN D 25 Hydroxy (Vit-D Deficiency, Fractures) (Completed)   Mixed hyperlipidemia   Most recent labs reviewed. Pt does not want to consider statins at this time. She will begin taking fish oil and continue to work on dietary and lifestyle modifications.  I evaluated patient, was consulted regarding treatment, and agree with assessment and plan per Denice Bors RN, FNP Student   Audria Nine, DNP, AGNP-C        Return in about 11 months (around 09/17/2024) for CPE.    Audria Nine, NP

## 2023-10-16 NOTE — Assessment & Plan Note (Addendum)
 Labs pending. If vitamin D competent then will consider transitioning into OTC supplementation.   I evaluated patient, was consulted regarding treatment, and agree with assessment and plan per Denice Bors RN, FNP Student   Audria Nine, DNP, AGNP-C

## 2023-10-16 NOTE — Assessment & Plan Note (Signed)
 Continue sertraline 150 mg daily and alprazolam 0.5 mg PRN. While improved on medication, she remains uncontrolled with symptoms. Given her upcoming appointment with psychiatry on 3/20 will defer further management to their expertise. She will continue with weekly talk therapy as well.

## 2023-10-16 NOTE — Patient Instructions (Addendum)
 It was a pleasure to see you today.  We will follow up once we receive your lab results.  You are due for your annual physical in 11 months, but let us know if you need [place] before then.

## 2023-10-16 NOTE — Progress Notes (Signed)
 c  Established Patient Office Visit  Subjective   Patient ID: Tracy Chang, female    DOB: 1986/05/27  Age: 38 y.o. MRN: [id number]  Chief Complaint  Patient presents with   Follow-up    Vitamin D.     HPI  Tracy Chang is here for a follow up on her chronic conditions today and to have her vitamin D levels rechecked.   Vitamin D: During last office visit Vitamin D levels was deficient at 21.09. A prescription for ergocaliferol  50,000 units once weekly was sent in. She started taking it on 1/16, but she did miss 2 weeks worth of doses this past month.   Asthma: On 3/1 Zenola was seen in UC for an asthma exacerbation. She was started on a Z-Pak. They gave her a Depo-Medrol 60 mg injection in the office and a prednisone taper pack. She finished her abx and took her last prednisone today. She reports overall improvement and does not feel sick anymore. She does endorse lingering symptoms of post nasal drip, nasal congestion, dry cough, and som ShOB that she associates with the nasal congestion and having to breath through her mouth.  GAD: During her visit in [place] she had recently experience a miscarriage and the sudden unexpected loss of her mother. She is currently being maintained on sertraline 150 mg that is managed by Dorisann Frames, APRN, CM. She is in talk therapy weekly and has alprazolam 0.5mg  daily PRN. During that time she reports having to use the alprazolam frequently. Since then she has reports improvement in symptoms and has decreased alprazolam usage to just a couple of times a week. She still has irritability and easily gets overwhelmed which impairs her ability to parent her son and challenges her at work. She did make contact with psychiatry and has an appointment on 3/20.   HLD:   On previous visit her lipid panel revealed an LDL of 144 and total cholesterol of 223. She is hoping that her dietary changes will help with those levels and reports a desire to start taking fish oil.  She endorses plans to join [employer] program offered by her employer. She does not want to be placed on statins at this time.       Review of Systems  Constitutional:  Negative for chills, fever and weight loss.  Respiratory:  Positive for cough and shortness of breath. Negative for sputum production and wheezing.   Cardiovascular:  Negative for chest pain and palpitations.  Gastrointestinal:  Negative for abdominal pain, nausea and vomiting.       BM daily  Musculoskeletal:  Negative for myalgias.  Skin:  Negative for rash.  Neurological:  Negative for dizziness and headaches.  Psychiatric/Behavioral:  Positive for depression. Negative for hallucinations and suicidal ideas. The patient is nervous/anxious.       Objective:     BP 110/68   Pulse 67   Temp 97.9 F (36.6 C) (Oral)   Ht 5\' 2"  (1.575 m)   Wt (!) 141.9 kg   LMP 10/08/2023 (Exact Date)   SpO2 95%   BMI 57.21 kg/m  BP Readings from Last 3 Encounters:  10/16/23 110/68  10/10/23 117/71  09/08/23 124/72   Wt Readings from Last 3 Encounters:  10/16/23 (!) 141.9 kg  09/08/23 (!) 139.2 kg  08/25/23 (!) 139.3 kg   SpO2 Readings from Last 3 Encounters:  10/16/23 95%  10/10/23 92%  09/08/23 96%      Physical Exam Constitutional:  Appearance: Normal appearance.  HENT:     Right Ear: Tympanic membrane, ear canal and external ear normal.     Left Ear: Tympanic membrane, ear canal and external ear normal.     Nose: Congestion and rhinorrhea present.     Mouth/Throat:     Mouth: Mucous membranes are moist.     Pharynx: Oropharynx is clear.  Cardiovascular:     Rate and Rhythm: Normal rate and regular rhythm.     Pulses: Normal pulses.     Heart sounds: Normal heart sounds. No murmur heard.    No friction rub. No gallop.  Pulmonary:     Effort: Pulmonary effort is normal.     Breath sounds: Normal breath sounds.  Abdominal:     General: Bowel sounds are normal.     Palpations: Abdomen is soft.   Musculoskeletal:     Cervical back: Neck supple.  Lymphadenopathy:     Cervical: No cervical adenopathy.  Neurological:     General: No focal deficit present.     Mental Status: She is alert and oriented to person, place, and time.     Deep Tendon Reflexes:     Reflex Scores:      Bicep reflexes are 2+ on the right side and 2+ on the left side.      Patellar reflexes are 2+ on the right side and 2+ on the left side.    Comments: Bilateral upper and lower extremity strength 5/5.  Psychiatric:        Mood and Affect: Mood normal.        Behavior: Behavior normal.      Results for orders placed or performed in visit on 10/16/23  VITAMIN D 25 Hydroxy (Vit-D Deficiency, Fractures)  Result Value Ref Range   VITD 23.09 (L) 30.00 - 100.00 ng/mL    Last lipids Lab Results  Component Value Date   CHOL 223 (H) 08/25/2023   HDL 50.10 08/25/2023   LDLCALC 144 (H) 08/25/2023   TRIG 145.0 08/25/2023   CHOLHDL 4 08/25/2023   Last vitamin D Lab Results  Component Value Date   VD25OH 23.09 (L) 10/16/2023      The ASCVD Risk score (Arnett DK, et al., 2019) failed to calculate for the following reasons:   The 2019 ASCVD risk score is only valid for ages 3 to 49    Assessment & Plan:   Problem List Items Addressed This Visit     Asthma   Continue montelukast 10mg  PO daily, and albuterol 108 mcg/act MDI PRN.       GAD (generalized anxiety disorder)   Continue sertraline 150 mg daily and alprazolam 0.5 mg PRN. While improved on medication, she remains uncontrolled with symptoms. Given her upcoming appointment with psychiatry on 3/20 will defer further management to their expertise. She will continue with weekly talk therapy as well.       Vitamin D deficiency - Primary   Labs pending. If vitamin D competent then will consider transitioning into OTC supplementation.       Relevant Orders   VITAMIN D 25 Hydroxy (Vit-D Deficiency, Fractures) (Completed)   Mixed hyperlipidemia    Most recent labs reviewed. Pt does not want to consider statins at this time. She will begin taking fish oil and continue to work on dietary and lifestyle modifications.       Return in about 11 months (around 09/17/2024) for CPE.    Murvin Donning, RN

## 2023-10-16 NOTE — Assessment & Plan Note (Addendum)
 Continue montelukast 10mg  PO daily, and albuterol 108 mcg/act MDI PRN.   I evaluated patient, was consulted regarding treatment, and agree with assessment and plan per Denice Bors RN, FNP Student   Audria Nine, DNP, AGNP-C

## 2023-10-19 ENCOUNTER — Other Ambulatory Visit: Payer: Self-pay | Admitting: Nurse Practitioner

## 2023-10-19 ENCOUNTER — Encounter: Payer: Self-pay | Admitting: Nurse Practitioner

## 2023-10-19 DIAGNOSIS — E559 Vitamin D deficiency, unspecified: Secondary | ICD-10-CM

## 2023-10-19 MED ORDER — VITAMIN D (ERGOCALCIFEROL) 1.25 MG (50000 UNIT) PO CAPS: 50000.0000 [IU] | ORAL_CAPSULE | ORAL | 0 refills | Status: AC

## 2023-10-20 ENCOUNTER — Telehealth: Admitting: Physician Assistant

## 2023-10-20 DIAGNOSIS — J019 Acute sinusitis, unspecified: Secondary | ICD-10-CM | POA: Diagnosis not present

## 2023-10-20 DIAGNOSIS — B9689 Other specified bacterial agents as the cause of diseases classified elsewhere: Secondary | ICD-10-CM

## 2023-10-20 MED ORDER — AMOXICILLIN 875 MG PO TABS
875.0000 mg | ORAL_TABLET | Freq: Two times a day (BID) | ORAL | 0 refills | Status: AC
Start: 2023-10-20 — End: 2023-10-30

## 2023-10-20 NOTE — Patient Instructions (Signed)
 Tracy Chang, thank you for joining Margaretann Loveless, PA-C for today's virtual visit.  While this provider is not your primary care provider (PCP), if your PCP is located in our provider database this encounter information will be shared with them immediately following your visit.   A [facility] MyChart account gives you access to today's visit and all your visits, tests, and labs performed at [facility] " click here if you don't have a [facility] MyChart account or go to [email or web address]  Consent: (Patient) Tracy Chang provided verbal consent for this virtual visit at the beginning of the encounter.  Current Medications:  Current Outpatient Medications:    amoxicillin (AMOXIL) 875 MG tablet, Take 1 tablet (875 mg total) by mouth 2 (two) times daily for 10 days., Disp: 20 tablet, Rfl: 0   albuterol (VENTOLIN HFA) 108 (90 Base) MCG/ACT inhaler, Inhale 1-2 puffs into the lungs every 6 (six) hours as needed for wheezing or shortness of breath., Disp: , Rfl:    ALPRAZolam (XANAX) 0.5 MG tablet, Take 1 tablet (0.5 mg total) by mouth at bedtime as needed for anxiety., Disp: 30 tablet, Rfl: 1   cetirizine (ZYRTEC) 10 MG tablet, Take 10 mg by mouth daily., Disp: , Rfl:    fluticasone (FLONASE) 50 MCG/ACT nasal spray, SPRAY 2 SPRAYS INTO EACH NOSTRIL EVERY DAY, Disp: 16 mL, Rfl: 0   letrozole (FEMARA) 2.5 MG tablet, Take by mouth., Disp: , Rfl:    metFORMIN (GLUCOPHAGE-XR) 500 MG 24 hr tablet, Take 1 tablet (500 mg total) by mouth daily with breakfast., Disp: 30 tablet, Rfl: 0   montelukast (SINGULAIR) 10 MG tablet, Take 1 tablet (10 mg total) by mouth at bedtime., Disp: 30 tablet, Rfl: 2   Prenatal Vit-Fe Fumarate-FA (PRENATAL MULTIVITAMIN) TABS tablet, Take 1 tablet by mouth daily at 12 noon., Disp: , Rfl:    Semaglutide-Weight Management (WEGOVY) 0.25 MG/0.5ML SOAJ, Inject 0.25 mg into the skin once a week., Disp: 2 mL, Rfl: 0   sertraline (ZOLOFT) 100 MG  tablet, Take 1.5 tablets (150 mg total) by mouth daily., Disp: 90 tablet, Rfl: 0   traZODone (DESYREL) 50 MG tablet, Take 50 mg by mouth daily., Disp: , Rfl:    valACYclovir (VALTREX) 1000 MG tablet, Take 1,000 mg by mouth as needed., Disp: , Rfl:    Vitamin D, Ergocalciferol, (DRISDOL) 1.25 MG (50000 UNIT) CAPS capsule, Take 1 capsule (50,000 Units total) by mouth every 7 (seven) days., Disp: 8 capsule, Rfl: 0   Medications ordered in this encounter:  Meds ordered this encounter  Medications   amoxicillin (AMOXIL) 875 MG tablet    Sig: Take 1 tablet (875 mg total) by mouth 2 (two) times daily for 10 days.    Dispense:  20 tablet    Refill:  0    Supervising Provider:   Merrilee Jansky [[id number]]     *If you need refills on other medications prior to your next appointment, please contact your pharmacy*  Follow-Up: Call back or seek an in-person evaluation if the symptoms worsen or if the condition fails to improve as anticipated.  [facility] [phone]  Other Instructions Sinus Infection, Adult A sinus infection, also called sinusitis, is inflammation of your sinuses. Sinuses are hollow spaces in the bones around your face. Your sinuses are located: Around your eyes. In the middle of your forehead. Behind your nose. In your cheekbones. Mucus normally drains out of your sinuses. When your nasal tissues become inflamed  or swollen, mucus can become trapped or blocked. This allows bacteria, viruses, and fungi to grow, which leads to infection. Most infections of the sinuses are caused by a virus. A sinus infection can develop quickly. It can last for up to 4 weeks (acute) or for more than 12 weeks (chronic). A sinus infection often develops after a cold. What are the causes? This condition is caused by anything that creates swelling in the sinuses or stops mucus from draining. This includes: Allergies. Asthma. Infection from bacteria or viruses. Deformities or  blockages in your nose or sinuses. Abnormal growths in the nose (nasal polyps). Pollutants, such as chemicals or irritants in the air. Infection from fungi. This is rare. What increases the risk? You are more likely to develop this condition if you: Have a weak body defense system (immune system). Do a lot of swimming or diving. Overuse nasal sprays. Smoke. What are the signs or symptoms? The main symptoms of this condition are pain and a feeling of pressure around the affected sinuses. Other symptoms include: Stuffy nose or congestion that makes it difficult to breathe through your nose. Thick yellow or greenish drainage from your nose. Tenderness, swelling, and warmth over the affected sinuses. A cough that may get worse at night. Decreased sense of smell and taste. Extra mucus that collects in the throat or the back of the nose (postnasal drip) causing a sore throat or bad breath. Tiredness (fatigue). Fever. How is this diagnosed? This condition is diagnosed based on: Your symptoms. Your medical history. A physical exam. Tests to find out if your condition is acute or chronic. This may include: Checking your nose for nasal polyps. Viewing your sinuses using a device that has a light (endoscope). Testing for allergies or bacteria. Imaging tests, such as an MRI or CT scan. In rare cases, a bone biopsy may be done to rule out more serious types of fungal sinus disease. How is this treated? Treatment for a sinus infection depends on the cause and whether your condition is chronic or acute. If caused by a virus, your symptoms should go away on their own within 10 days. You may be given medicines to relieve symptoms. They include: Medicines that shrink swollen nasal passages (decongestants). A spray that eases inflammation of the nostrils (topical intranasal corticosteroids). Rinses that help get rid of thick mucus in your nose (nasal saline washes). Medicines that treat allergies  (antihistamines). Over-the-counter pain relievers. If caused by bacteria, your health care provider may recommend waiting to see if your symptoms improve. Most bacterial infections will get better without antibiotic medicine. You may be given antibiotics if you have: A severe infection. A weak immune system. If caused by narrow nasal passages or nasal polyps, surgery may be needed. Follow these instructions at home: Medicines Take, use, or apply over-the-counter and prescription medicines only as told by your health care provider. These may include nasal sprays. If you were prescribed an antibiotic medicine, take it as told by your health care provider. Do not stop taking the antibiotic even if you start to feel better. Hydrate and humidify  Drink enough fluid to keep your urine pale yellow. Staying hydrated will help to thin your mucus. Use a cool mist humidifier to keep the humidity level in your home above 50%. Inhale steam for 10-15 minutes, 3-4 times a day, or as told by your health care provider. You can do this in the bathroom while a hot shower is running. Limit your exposure to cool or  dry air. Rest Rest as much as possible. Sleep with your head raised (elevated). Make sure you get enough sleep each night. General instructions  Apply a warm, moist washcloth to your face 3-4 times a day or as told by your health care provider. This will help with discomfort. Use nasal saline washes as often as told by your health care provider. Wash your hands often with soap and water to reduce your exposure to germs. If soap and water are not available, use hand sanitizer. Do not smoke. Avoid being around people who are smoking (secondhand smoke). Keep all follow-up visits. This is important. Contact a health care provider if: You have a fever. Your symptoms get worse. Your symptoms do not improve within 10 days. Get help right away if: You have a severe headache. You have persistent  vomiting. You have severe pain or swelling around your face or eyes. You have vision problems. You develop confusion. Your neck is stiff. You have trouble breathing. These symptoms may be an emergency. Get help right away. Call 911. Do not wait to see if the symptoms will go away. Do not drive yourself to the hospital. Summary A sinus infection is soreness and inflammation of your sinuses. Sinuses are hollow spaces in the bones around your face. This condition is caused by nasal tissues that become inflamed or swollen. The swelling traps or blocks the flow of mucus. This allows bacteria, viruses, and fungi to grow, which leads to infection. If you were prescribed an antibiotic medicine, take it as told by your health care provider. Do not stop taking the antibiotic even if you start to feel better. Keep all follow-up visits. This is important. This information is not intended to replace advice given to you by your health care provider. Make sure you discuss any questions you have with your health care provider. Document Revised: 07/02/2021 Document Reviewed: 07/02/2021 Elsevier Patient Education  2024 [employer]vier [employer]   If you have been instructed to have an in-person evaluation today at a local Urgent Care facility, please use the link below. It will take you to a list of all of our available [facility], including address, phone number and hours of operation. Please do not delay care.  [facility]  If you or a family member do not have a primary care provider, use the link below to schedule a visit and establish care. When you choose a [facility] primary care physician or advanced practice provider, you gain a long-term partner in health. Find a Primary Care Provider  Learn more about [facility]'s in-office and virtual care options: [facility] - Get Care Now

## 2023-10-20 NOTE — Progress Notes (Signed)
 Virtual Visit Consent   Tracy Chang, you are scheduled for a virtual visit with a [facility] provider today. Just as with appointments in the office, your consent must be obtained to participate. Your consent will be active for this visit and any virtual visit you may have with one of our providers in the next 365 days. If you have a MyChart account, a copy of this consent can be sent to you electronically.  As this is a virtual visit, video technology does not allow for your provider to perform a traditional examination. This may limit your provider's ability to fully assess your condition. If your provider identifies any concerns that need to be evaluated in person or the need to arrange testing (such as labs, EKG, etc.), we will make arrangements to do so. Although advances in technology are sophisticated, we cannot ensure that it will always work on either your end or our end. If the connection with a video visit is poor, the visit may have to be switched to a telephone visit. With either a video or telephone visit, we are not always able to ensure that we have a secure connection.  By engaging in this virtual visit, you consent to the provision of healthcare and authorize for your insurance to be billed (if applicable) for the services provided during this visit. Depending on your insurance coverage, you may receive a charge related to this service.  I need to obtain your verbal consent now. Are you willing to proceed with your visit today? Rogina Schiano has provided verbal consent on 10/20/2023 for a virtual visit (video or telephone). Margaretann Loveless, PA-C  Date: 10/20/2023 2:50 PM   Virtual Visit via Video Note   I, Margaretann Loveless, connected with  Tracy Chang  ([id number], August 20, 1985) on 10/20/23 at  2:45 PM EDT by a video-enabled telemedicine application and verified that I am speaking with the correct person using two identifiers.  Location: Patient: Virtual  Visit Location Patient: Home Provider: Virtual Visit Location Provider: Home Office   I discussed the limitations of evaluation and management by telemedicine and the availability of in person appointments. The patient expressed understanding and agreed to proceed.    History of Present Illness: Tracy Chang is a 38 y.o. who identifies as a female who was assigned female at birth, and is being seen today for sinus congestion and pain.  HPI: Sinusitis This is a new problem. The current episode started in the past 7 days. The problem has been gradually worsening since onset. There has been no fever. The pain is moderate. Associated symptoms include congestion, coughing (mild), ear pain, headaches and sinus pressure. Pertinent negatives include no chills, hoarse voice, shortness of breath or sore throat. (Post nasal drainage) Treatments tried: cetirizine, mucinex, afrin, flonase, singulair. The treatment provided no relief.  Seen 10/10/23 at UC in person and given Zpack and Prednisone for an asthma exacerbation and bronchitis. Those symptoms have resolved but the sinus symptoms escalated.   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   GAD (generalized anxiety disorder) 08/25/2023   Prediabetes 08/25/2023   Vitamin D deficiency 08/25/2023   Anemia associated with acute blood loss EBL 724 08/07/2020   Anxiety 08/06/2020   Asthma  08/06/2020   Failure to progress in labor 08/06/2020   Status post primary low transverse cesarean section 12/27 08/06/2020   Postpartum care following cesarean delivery 12/27 08/06/2020   Morbid obesity (HCC) 08/06/2020   Gestational hypertension 08/05/2020    Allergies:  Allergies  Allergen  Reactions   Augmentin [Amoxicillin-Pot Clavulanate] Hives    States that she can take amoxicillin   Ceclor [Cefaclor] Hives   Demerol [Meperidine] Hives   Meloxicam Swelling    Angioedema   Latex    Versed [Midazolam]     Panic attack while on versed   Compazine [Prochlorperazine] Anxiety   Medications:  Current Outpatient Medications:    amoxicillin (AMOXIL) 875 MG tablet, Take 1 tablet (875 mg total) by mouth 2 (two) times daily for 10 days., Disp: 20 tablet, Rfl: 0   albuterol (VENTOLIN HFA) 108 (90 Base) MCG/ACT inhaler, Inhale 1-2 puffs into the lungs every 6 (six) hours as needed for wheezing or shortness of breath., Disp: , Rfl:    ALPRAZolam (XANAX) 0.5 MG tablet, Take 1 tablet (0.5 mg total) by mouth at bedtime as needed for anxiety., Disp: 30 tablet, Rfl: 1   cetirizine (ZYRTEC) 10 MG tablet, Take 10 mg by mouth daily., Disp: , Rfl:    fluticasone (FLONASE) 50 MCG/ACT nasal spray, SPRAY 2 SPRAYS INTO EACH NOSTRIL EVERY DAY, Disp: 16 mL, Rfl: 0   letrozole (FEMARA) 2.5 MG tablet, Take by mouth., Disp: , Rfl:    metFORMIN (GLUCOPHAGE-XR) 500 MG 24 hr tablet, Take 1 tablet (500 mg total) by mouth daily with breakfast., Disp: 30 tablet, Rfl: 0   montelukast (SINGULAIR) 10 MG tablet, Take 1 tablet (10 mg total) by mouth at bedtime., Disp: 30 tablet, Rfl: 2   Prenatal Vit-Fe Fumarate-FA (PRENATAL MULTIVITAMIN) TABS tablet, Take 1 tablet by mouth daily at 12 noon., Disp: , Rfl:    Semaglutide-Weight Management (WEGOVY) 0.25 MG/0.5ML SOAJ, Inject 0.25 mg into the skin once a week., Disp: 2 mL, Rfl: 0   sertraline (ZOLOFT) 100 MG tablet, Take 1.5 tablets (150 mg total) by mouth daily., Disp: 90 tablet, Rfl: 0   traZODone (DESYREL) 50 MG tablet, Take 50 mg by mouth daily., Disp: , Rfl:    valACYclovir (VALTREX) 1000 MG tablet, Take 1,000 mg by mouth as needed., Disp: , Rfl:    Vitamin D, Ergocalciferol, (DRISDOL) 1.25 MG (50000 UNIT) CAPS capsule, Take 1 capsule (50,000 Units total) by  mouth every 7 (seven) days., Disp: 8 capsule, Rfl: 0  Observations/Objective: Patient is well-developed, well-nourished in no acute distress.  Resting comfortably at home.  Head is normocephalic, atraumatic.  No labored breathing.  Speech is clear and coherent with logical content.  Patient is alert and oriented at baseline.    Assessment and Plan: 1. Acute bacterial sinusitis (Primary) - amoxicillin (AMOXIL) 875 MG tablet; Take 1 tablet (875 mg total) by mouth 2 (two) times daily for 10 days.  Dispense: 20 tablet; Refill: 0  - Worsening symptoms that have not responded to OTC medications.  - Will give Amoxicillin - Continue allergy medications.  - Steam and humidifier can help - Stay well hydrated and get plenty of rest.  - Seek in person evaluation if no symptom improvement or if symptoms worsen   Follow Up Instructions: I discussed the assessment and treatment plan with the patient. The patient was provided an opportunity to ask questions and all were answered. The patient agreed with the plan and demonstrated an understanding of the instructions.  A copy  of instructions were sent to the patient via MyChart unless otherwise noted below.    The patient was advised to call back or seek an in-person evaluation if the symptoms worsen or if the condition fails to improve as anticipated.    Margaretann Loveless, PA-C

## 2023-10-21 ENCOUNTER — Other Ambulatory Visit (HOSPITAL_COMMUNITY): Payer: Self-pay

## 2023-10-27 DIAGNOSIS — F4310 Post-traumatic stress disorder, unspecified: Secondary | ICD-10-CM | POA: Diagnosis not present

## 2023-10-29 ENCOUNTER — Telehealth: Payer: Self-pay | Admitting: Psychiatry

## 2023-10-29 ENCOUNTER — Ambulatory Visit (INDEPENDENT_AMBULATORY_CARE_PROVIDER_SITE_OTHER): Payer: Self-pay | Admitting: Psychiatry

## 2023-10-29 ENCOUNTER — Encounter: Payer: Self-pay | Admitting: Psychiatry

## 2023-10-29 ENCOUNTER — Other Ambulatory Visit (HOSPITAL_COMMUNITY): Payer: Self-pay

## 2023-10-29 VITALS — BP 126/68 | HR 78 | Temp 98.7°F | Ht 62.0 in | Wt 313.6 lb

## 2023-10-29 DIAGNOSIS — F4321 Adjustment disorder with depressed mood: Secondary | ICD-10-CM | POA: Diagnosis not present

## 2023-10-29 DIAGNOSIS — F411 Generalized anxiety disorder: Secondary | ICD-10-CM | POA: Diagnosis not present

## 2023-10-29 DIAGNOSIS — F332 Major depressive disorder, recurrent severe without psychotic features: Secondary | ICD-10-CM | POA: Diagnosis not present

## 2023-10-29 DIAGNOSIS — F900 Attention-deficit hyperactivity disorder, predominantly inattentive type: Secondary | ICD-10-CM | POA: Diagnosis not present

## 2023-10-29 DIAGNOSIS — F50811 Binge eating disorder, moderate: Secondary | ICD-10-CM

## 2023-10-29 MED ORDER — JORNAY PM 40 MG PO CP24
40.0000 mg | ORAL_CAPSULE | Freq: Every day | ORAL | 0 refills | Status: DC
  Filled 2023-10-29: qty 30, 30d supply, fill #0

## 2023-10-29 MED ORDER — ALPRAZOLAM 0.25 MG PO TABS
0.2500 mg | ORAL_TABLET | Freq: Two times a day (BID) | ORAL | 0 refills | Status: AC | PRN
  Filled 2023-10-29 – 2023-11-17 (×2): qty 20, 10d supply, fill #0

## 2023-10-29 MED ORDER — BUPROPION HCL ER (SR) 150 MG PO TB12
150.0000 mg | ORAL_TABLET | Freq: Two times a day (BID) | ORAL | 0 refills | Status: DC
  Filled 2023-10-29 – 2023-11-17 (×2): qty 50, 25d supply, fill #0

## 2023-10-29 NOTE — Telephone Encounter (Signed)
 PT will call back later today when her work schedule opens up so she can schedule her 1 month follow up
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
## 2023-10-30 ENCOUNTER — Telehealth: Payer: Self-pay | Admitting: Psychiatry

## 2023-10-30 ENCOUNTER — Other Ambulatory Visit (HOSPITAL_COMMUNITY): Payer: Self-pay

## 2023-10-30 NOTE — Telephone Encounter (Signed)
 Patient called stating the pharmacy submitted prior auth on the jornay and now waiting for our office to do what needs to be done for prior auth. Please advise

## 2023-11-02 ENCOUNTER — Other Ambulatory Visit (HOSPITAL_COMMUNITY): Payer: Self-pay

## 2023-11-03 DIAGNOSIS — F4310 Post-traumatic stress disorder, unspecified: Secondary | ICD-10-CM | POA: Diagnosis not present

## 2023-11-11 ENCOUNTER — Telehealth: Payer: Self-pay

## 2023-11-11 ENCOUNTER — Other Ambulatory Visit (HOSPITAL_COMMUNITY): Payer: Self-pay

## 2023-11-11 NOTE — Telephone Encounter (Signed)
 went online to [email or web address] and submitted the prior auth- approved from 11-11-23 to 11-09-24

## 2023-11-11 NOTE — Telephone Encounter (Signed)
faxed and confirmed approval notice.  

## 2023-11-11 NOTE — Telephone Encounter (Signed)
 Pt.notified

## 2023-11-12 ENCOUNTER — Other Ambulatory Visit (HOSPITAL_COMMUNITY): Payer: Self-pay

## 2023-11-17 ENCOUNTER — Other Ambulatory Visit (HOSPITAL_COMMUNITY): Payer: Self-pay

## 2023-11-19 DIAGNOSIS — F4310 Post-traumatic stress disorder, unspecified: Secondary | ICD-10-CM | POA: Diagnosis not present

## 2023-11-24 DIAGNOSIS — F4310 Post-traumatic stress disorder, unspecified: Secondary | ICD-10-CM | POA: Diagnosis not present

## 2023-12-01 ENCOUNTER — Other Ambulatory Visit (HOSPITAL_COMMUNITY): Payer: Self-pay

## 2023-12-01 ENCOUNTER — Encounter: Payer: Self-pay | Admitting: Psychiatry

## 2023-12-01 ENCOUNTER — Other Ambulatory Visit
Admission: RE | Admit: 2023-12-01 | Discharge: 2023-12-01 | Disposition: A | Discharge status: Home / Self Care | Source: Ambulatory Visit | Attending: Psychiatry | Admitting: Psychiatry

## 2023-12-01 ENCOUNTER — Other Ambulatory Visit: Payer: Self-pay

## 2023-12-01 ENCOUNTER — Ambulatory Visit (INDEPENDENT_AMBULATORY_CARE_PROVIDER_SITE_OTHER): Admitting: Psychiatry

## 2023-12-01 VITALS — BP 135/81 | HR 81 | Temp 97.4°F | Ht 62.0 in | Wt 310.8 lb

## 2023-12-01 DIAGNOSIS — F4310 Post-traumatic stress disorder, unspecified: Secondary | ICD-10-CM | POA: Diagnosis not present

## 2023-12-01 DIAGNOSIS — F411 Generalized anxiety disorder: Secondary | ICD-10-CM | POA: Diagnosis not present

## 2023-12-01 DIAGNOSIS — F4321 Adjustment disorder with depressed mood: Secondary | ICD-10-CM | POA: Diagnosis not present

## 2023-12-01 DIAGNOSIS — F331 Major depressive disorder, recurrent, moderate: Secondary | ICD-10-CM | POA: Diagnosis not present

## 2023-12-01 DIAGNOSIS — F900 Attention-deficit hyperactivity disorder, predominantly inattentive type: Secondary | ICD-10-CM | POA: Diagnosis not present

## 2023-12-01 DIAGNOSIS — F50811 Binge eating disorder, moderate: Secondary | ICD-10-CM

## 2023-12-01 MED ORDER — HYDROXYZINE HCL 25 MG PO TABS
25.0000 mg | ORAL_TABLET | Freq: Three times a day (TID) | ORAL | 2 refills | Status: AC | PRN
  Filled 2023-12-01 – 2023-12-11 (×2): qty 90, 30d supply, fill #0

## 2023-12-01 MED ORDER — LISDEXAMFETAMINE DIMESYLATE 50 MG PO CAPS
50.0000 mg | ORAL_CAPSULE | Freq: Every day | ORAL | 0 refills | Status: DC
  Filled 2023-12-01 – 2023-12-11 (×2): qty 30, 30d supply, fill #0

## 2023-12-01 NOTE — Progress Notes (Signed)
 [facility] MD/PA/NP OP Progress Note  12/01/2023 11:13 AM Tracy Chang  MRN:  [id number]  Chief Complaint:  Chief Complaint  Patient presents with   Follow-up   HPI: 38 year old female presenting to [facility] for follow-up appointment.  Patient reports that she just came back from vacation in which she had a great time finding closure with the loss of her mother by [place] her ashes [place] the ocean.  Patient reports that her husband was very supportive and she was able to have good memories created while processing recent events.  Patient reports that she is currently taking semaglutide  in which is being managed by her PCP.  Based on this finding is recommended for the patient to stop Wellbutrin  in which she is being instructed to take 150 mg for the next 2 weeks and then stop starting week 3.  Patient also reported poor response to Jornay 40 mg stating that she has been unable to remember to take it at the appropriate time and states that she would like to seek another medication.  Patient also reporting that she is out of Xanax  as she has taken all of her doses stating that they have been therapeutic but she has been reminded that at this clinic we do not prescribe benzos and that that was her last prescription that she is in agreement with.  Patient reports decreased depression as well as anxiety stating that she still needed medications to help manage his anxiety but she states that there is a huge difference as she has been grieving better.  She still reports "grieving attacks "that are unpredictable but is better manage.  Based on this assessment interview is recommended for the patient to stop Jornay, Xanax  and Wellbutrin  (tapering).  Patient will take Vyvanse  50 mg once daily for ADHD.  Patient will also take hydroxyzine  25 mg 3 times a day as needed as a replacement for Xanax  and has been educated on the sedation of the medication and has been instructed not to drive or operate machinery with medication.   With Vyvanse  prescription patient has agreed that she will schedule neuropsych testing as well as to complete a drug test to be compliant with prescription.  Patient with no other needs or concerns at this time patient is in agreement with treatment plan patient will follow up in 1 month.  Visit Diagnosis:    ICD-10-CM   1. Grieving  F43.21     2. Attention deficit hyperactivity disorder (ADHD), predominantly inattentive type  F90.0 lisdexamfetamine (VYVANSE ) 50 MG capsule    Drugs of abuse scrn w alc, routine urine    3. Moderate episode of recurrent major depressive disorder (HCC)  F33.1     4. GAD (generalized anxiety disorder)  F41.1 hydrOXYzine  (ATARAX ) 25 MG tablet     Problems Grieving -Participating in talk therapy weekly, reporting improvement of symptoms since last visit.   ADHD, predominantly inattentive - Vyvanse  50 mg once daily - With stimulant patient agrees to schedule neuropsych testing and to participate in drug tests when requested.  - Drug test being completed today.  MDD, recurrent, moderate - Sertraline  150 mg once daily prescribed by midwife  GAD  - Hydroxyzine  25 mg 3 times a day as needed    Past Psychiatric History:  Previous Psych Hospitalizations: Denies  Outpatient treatment: Currently being treated at [facility], and seeing a talk therapist weekly.   Medications Current:  Medication Trials: - Adderall, states poor response as a child - Adderall XR, states poor response  as a child - Strattera, states poor response as a child  Suicide & Violence: Denies SI, HI, AVH.  Psychotherapy: Currently participate and talk therapy once weekly  Legal: Denies   Past Medical History:  Past Medical History:  Diagnosis Date   ADD (attention deficit disorder)    Anxiety    Asthma    Back pain    Complication of anesthesia    Conscious sedation-extreme agitation   Fatty liver    Gallbladder problem    Generalized anxiety disorder    Heartburn     High blood pressure    High cholesterol    Irritable bowel syndrome    Joint pain    Multiple food allergies    Post partum depression    Prediabetes    Pregnancy induced hypertension    SOB (shortness of breath)     Past Surgical History:  Procedure Laterality Date   CESAREAN SECTION N/A 08/06/2020   Procedure: CESAREAN SECTION;  Surgeon: Meriam Stamp, MD;  Location: [facility] LD ORS;  Service: Obstetrics;  Laterality: N/A;   CHOLECYSTECTOMY     TONSILLECTOMY      Family Psychiatric History: No known psychiatric history in the family   Family History:  Family History  Problem Relation Age of Onset   Cancer Mother        lymphoma   Hyperlipidemia Mother    Hypertension Mother    Heart disease Mother    Kidney disease Mother    Depression Mother    Anxiety disorder Mother    Sleep apnea Mother    Eating disorder Mother    Obesity Mother    Cancer Father        lung    Social History:  Social History   Socioeconomic History   Marital status: Married    Spouse name: [name]   Number of children: 1   Years of education: Not on file   Highest education level: Associate degree: occupational, Scientist, product/process development, or vocational program  Occupational History   Occupation: Nurse  Tobacco Use   Smoking status: Never   Smokeless tobacco: Never  Vaping Use   Vaping status: Never Used  Substance and Sexual Activity   Alcohol use: Not Currently    Comment: SOCIAL   Drug use: Never   Sexual activity: Yes  Other Topics Concern   Not on file  Social History Narrative   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    Social Drivers of Health   Financial Resource Strain: Low Risk  (12/18/2022)   Received from [facility]   Overall Financial Resource Strain (CARDIA)    Difficulty of Paying Living Expenses: Not hard at all  Food Insecurity: No Food Insecurity (12/18/2022)   Received [facility]   Hunger Vital Sign    Worried About Running Out of Food in the Last Year: Never true     Ran Out of Food in the Last Year: Never true  Transportation Needs: No Transportation Needs (12/18/2022)   Received from [facility] - Transportation    Lack of Transportation (Medical): No    Lack of Transportation (Non-Medical): No  Physical Activity: Not on file  Stress: Not on file  Social Connections: Unknown (12/10/2021)   [facility] from [facility]   Social Network    Social Network: Not on file    Allergies:  Allergies  Allergen Reactions   Augmentin  [Amoxicillin -Pot Clavulanate] Hives    States that she can take  amoxicillin    Ceclor [Cefaclor] Hives   Demerol [Meperidine] Hives   Meloxicam  Swelling    Angioedema   Latex    Versed [Midazolam]     Panic attack while on versed   Compazine [Prochlorperazine] Anxiety    Metabolic Disorder Labs: Lab Results  Component Value Date   HGBA1C 5.9 08/25/2023   No results found for: "PROLACTIN" Lab Results  Component Value Date   CHOL 223 (H) 08/25/2023   TRIG 145.0 08/25/2023   HDL 50.10 08/25/2023   CHOLHDL 4 08/25/2023   VLDL 29.0 08/25/2023   LDLCALC 144 (H) 08/25/2023   LDLCALC 99 05/13/2022   Lab Results  Component Value Date   TSH 2.25 08/25/2023   TSH 3.590 12/03/2021    Therapeutic Level Labs: No results found for: "LITHIUM" No results found for: "VALPROATE" No results found for: "CBMZ"  Current Medications: Current Outpatient Medications  Medication Sig Dispense Refill   albuterol (VENTOLIN HFA) 108 (90 Base) MCG/ACT inhaler Inhale 1-2 puffs into the lungs every 6 (six) hours as needed for wheezing or shortness of breath.     ALPRAZolam  (XANAX ) 0.25 MG tablet Take 1 tablet (0.25 mg total) by mouth 2 (two) times daily as needed for anxiety. 20 tablet 0   buPROPion  (WELLBUTRIN  SR) 150 MG 12 hr tablet Take 1 tablet (150 mg total) by mouth 2 (two) times daily. 50 tablet 0   cetirizine (ZYRTEC) 10 MG tablet Take 10 mg by mouth daily.     fluticasone  (FLONASE ) 50 MCG/ACT nasal  spray SPRAY 2 SPRAYS INTO EACH NOSTRIL EVERY DAY 16 mL 0   letrozole (FEMARA) 2.5 MG tablet Take by mouth.     metFORMIN  (GLUCOPHAGE -XR) 500 MG 24 hr tablet Take 1 tablet (500 mg total) by mouth daily with breakfast. 30 tablet 0   Methylphenidate  HCl ER, PM, (JORNAY PM ) 40 MG CP24 Take 1 capsule (40 mg total) by mouth at bedtime. 30 capsule 0   montelukast  (SINGULAIR ) 10 MG tablet Take 1 tablet (10 mg total) by mouth at bedtime. 30 tablet 2   Prenatal Vit-Fe Fumarate-FA (PRENATAL MULTIVITAMIN) TABS tablet Take 1 tablet by mouth daily at 12 noon.     Semaglutide -Weight Management (WEGOVY ) 0.25 MG/0.5ML SOAJ Inject 0.25 mg into the skin once a week. 2 mL 0   sertraline  (ZOLOFT ) 100 MG tablet Take 1.5 tablets (150 mg total) by mouth daily. 90 tablet 0   traZODone (DESYREL) 50 MG tablet Take 50 mg by mouth daily.     valACYclovir (VALTREX) 1000 MG tablet Take 1,000 mg by mouth as needed.     Vitamin D , Ergocalciferol , (DRISDOL ) 1.25 MG (50000 UNIT) CAPS capsule Take 1 capsule (50,000 Units total) by mouth every 7 (seven) days. 8 capsule 0   No current facility-administered medications for this visit.     Musculoskeletal: Strength & Muscle Tone: within normal limits Gait & Station: normal Patient leans: N/A  Psychiatric Specialty Exam: Review of Systems  Constitutional: Negative.   HENT: Negative.    Eyes: Negative.   Respiratory: Negative.    Cardiovascular: Negative.   Gastrointestinal: Negative.   Endocrine: Negative.   Genitourinary: Negative.   Musculoskeletal: Negative.   Allergic/Immunologic: Negative.   Neurological: Negative.   Hematological: Negative.   Psychiatric/Behavioral:  Positive for decreased concentration. The patient is nervous/anxious.     Blood pressure 135/81, pulse 81, temperature (!) 97.4 F (36.3 C), temperature source Temporal, height 5\' 2"  (1.575 m), weight (!) 310 lb 12.8 oz (141 kg).Body mass index is  56.85 kg/m.  General Appearance: Well Groomed  Eye  Contact:  Good  Speech:  Clear and Coherent  Volume:  Normal  Mood:  Anxious  Affect:  Appropriate  Thought Process:  Goal Directed  Orientation:  Full (Time, Place, and Person)  Thought Content: Logical   Suicidal Thoughts:  No  Homicidal Thoughts:  No  Memory:  Immediate;   Good Recent;   Good Remote;   Good  Judgement:  Good  Insight:  Good  Psychomotor Activity:  Normal  Concentration:  Concentration: Good and Attention Span: Good  Recall:  Good  Fund of Knowledge: Good  Language: Good  Akathisia:  No  Handed:  Right  AIMS (if indicated):   Assets:  Desire for Improvement Financial Resources/Insurance Vocational/Educational  ADL's:  Intact  Cognition: WNL  Sleep:  Good   Screenings: GAD-7    Flowsheet Row Office Visit from 10/16/2023 in [facility] Office Visit from 08/25/2023 in [facility]  Total GAD-7 Score 9 18      PHQ2-9    Flowsheet Row Office Visit from 10/29/2023 in [facility] Office Visit from 10/16/2023 in [facility] Office Visit from 08/25/2023 in [facility] [place] Office Visit from 12/03/2021 in [facility] Total Score 3 2 6 3   PHQ-9 Total Score 11 6 17 11       Flowsheet Row Office Visit from 10/29/2023 in [facility] ED from 10/10/2023 in [facility]  ED from 10/18/2022 in [facility] RISK CATEGORY No Risk No Risk No Risk        Assessment and Plan: Assessment - Diagnosis: Grieving [F43.21]  2. Severe episode of recurrent major depressive disorder, without psychotic features  3. GAD (generalized anxiety disorder) [F41.1]  4. Attention deficit hyperactivity disorder (ADHD), predominantly inattentive type [F90.0] , Childhood onset. 5.   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  - Risk Factors: Suicidal risk, stimulant use disorder risk  Plan - Medications:  Continue sertraline  150 mg p.o. once daily prescribed by midwife, for MDD Decrease Wellbutrin  150 mg p.o. for depression, due to poor response for 2 weeks.  Then stop medication on week 3. Stop Garen Juneau, due to poor response Start Vyvanse  50mg , for ADHD with agreement to get neuropsych testing to be scheduled, drug test to be completed today.  Start Hydroxyzine  25mg  TID as needed for anxiety. Pt educated to not taking medication - Psychotherapy: Recommended for weekly talk therapy, patient currently with therapist Carlee Charters and satisfied with therapy. - Education: Patient educated on ADHD symptoms as well as medication management.  Patient also educated on Xanax  long-term use and was advised to taper in which she was in agreement.  Patient educated on medications, purpose, usage, side effects and adverse reactions and notified to reach out to clinic should she have any worsening symptoms. - Follow-Up: Patient will follow-up in 1 month for medication monitoring - Referrals: No referrals - Safety Planning: Patient safety plan stating that she will call 911 or go to the emergency department should she have suicidal ideation with or without a plan.  Patient also states no firearms within the home.  Patient is in agreement with safety planning.     Patient/Guardian was advised Release of Information must be obtained prior to any record release in order to collaborate their care with an outside provider. Patient/Guardian was advised if they have not already done so to contact the registration department to sign all necessary forms in order for us  to release information regarding their care.    Consent: Patient/Guardian gives verbal consent for treatment and assignment of benefits for services provided during this visit. Patient/Guardian expressed understanding and agreed to proceed.    Arlana Labor, NP 12/01/2023, 11:13 AM

## 2023-12-02 LAB — URINE DRUGS OF ABUSE SCREEN W ALC, ROUTINE (REF LAB)
Amphetamines, Urine: NEGATIVE ng/mL
Barbiturate, Ur: NEGATIVE ng/mL
Benzodiazepine Quant, Ur: NEGATIVE ng/mL
Cannabinoid Quant, Ur: NEGATIVE ng/mL
Cocaine (Metab.): NEGATIVE ng/mL
Creatinine, Urine: 86.8 mg/dL (ref 20.0–300.0)
Ethanol U, Quan: NEGATIVE %
Methadone Screen, Urine: NEGATIVE ng/mL
Nitrite Urine, Quantitative: NEGATIVE ug/mL
OPIATE SCREEN URINE: NEGATIVE ng/mL
Phencyclidine, Ur: NEGATIVE ng/mL
Propoxyphene, Urine: NEGATIVE ng/mL
pH, Urine: 5.5 (ref 4.5–8.9)

## 2023-12-08 DIAGNOSIS — F4310 Post-traumatic stress disorder, unspecified: Secondary | ICD-10-CM | POA: Diagnosis not present

## 2023-12-09 ENCOUNTER — Other Ambulatory Visit (HOSPITAL_COMMUNITY): Payer: Self-pay

## 2023-12-09 ENCOUNTER — Other Ambulatory Visit: Payer: Self-pay | Admitting: Nurse Practitioner

## 2023-12-09 MED ORDER — SERTRALINE HCL 100 MG PO TABS
ORAL_TABLET | ORAL | 0 refills | Status: DC
Start: 2023-12-09 — End: 2024-02-04
  Filled 2023-12-09: qty 90, 60d supply, fill #0

## 2023-12-10 ENCOUNTER — Other Ambulatory Visit (HOSPITAL_COMMUNITY): Payer: Self-pay

## 2023-12-10 MED ORDER — MONTELUKAST SODIUM 10 MG PO TABS
10.0000 mg | ORAL_TABLET | Freq: Every day | ORAL | 1 refills | Status: AC
  Filled 2023-12-10: qty 90, 90d supply, fill #0
  Filled 2024-03-25 – 2024-06-04 (×3): qty 90, 90d supply, fill #1

## 2023-12-11 ENCOUNTER — Other Ambulatory Visit (HOSPITAL_COMMUNITY): Payer: Self-pay

## 2023-12-15 DIAGNOSIS — F4310 Post-traumatic stress disorder, unspecified: Secondary | ICD-10-CM | POA: Diagnosis not present

## 2023-12-31 ENCOUNTER — Other Ambulatory Visit (HOSPITAL_COMMUNITY): Payer: Self-pay

## 2023-12-31 ENCOUNTER — Telehealth (INDEPENDENT_AMBULATORY_CARE_PROVIDER_SITE_OTHER): Admitting: Psychiatry

## 2023-12-31 DIAGNOSIS — F411 Generalized anxiety disorder: Secondary | ICD-10-CM | POA: Diagnosis not present

## 2023-12-31 DIAGNOSIS — F4321 Adjustment disorder with depressed mood: Secondary | ICD-10-CM

## 2023-12-31 DIAGNOSIS — F331 Major depressive disorder, recurrent, moderate: Secondary | ICD-10-CM

## 2023-12-31 DIAGNOSIS — F50811 Binge eating disorder, moderate: Secondary | ICD-10-CM

## 2023-12-31 DIAGNOSIS — F900 Attention-deficit hyperactivity disorder, predominantly inattentive type: Secondary | ICD-10-CM | POA: Diagnosis not present

## 2023-12-31 MED ORDER — LISDEXAMFETAMINE DIMESYLATE 50 MG PO CAPS
50.0000 mg | ORAL_CAPSULE | Freq: Every day | ORAL | 0 refills | Status: DC
  Filled 2023-12-31 – 2024-02-04 (×2): qty 30, 30d supply, fill #0

## 2023-12-31 NOTE — Progress Notes (Cosign Needed Addendum)
 [facility] MD/[facility]/NP OP Progress Note  12/31/2023 9:02 AM Tracy Chang  MRN:  [id number]  Chief Complaint: Routine follow-up  Virtual Visit via Video Note  I connected with Tracy Chang on 12/31/23 at  9:00 AM EDT by a video enabled telemedicine application and verified that I am speaking with the correct person using two identifiers.  Location: Patient: [address]  [name] [address]  Provider: [facility]   I discussed the limitations of evaluation and management by telemedicine and the availability of in person appointments. The patient expressed understanding and agreed to proceed.    I discussed the assessment and treatment plan with the patient. The patient was provided an opportunity to ask questions and all were answered. The patient agreed with the plan and demonstrated an understanding of the instructions.   The patient was advised to call back or seek an in-person evaluation if the symptoms worsen or if the condition fails to improve as anticipated.  I provided 30 minutes of non-face-to-face time during this encounter.   Arlana Labor, NP    HPI: 38 year old female presents [facility] for follow-up.  [place]  Patient reports that she has been having better control at work stating that she is able to focus due to the stimulant medication of Vyvanse .  Patient reporting that she is feeling better about the grieving episodes regarding her mother and finding closure and also feeling better as she continues her therapy with a grief counselor.  Patient reports though that there is an increased episode of agitation and irritation while she is at home taking care of her child as well as understand situations and when she gets into guard arguments with her spouse.  Patient also reports that she is feeling "numbing" affect on her mood stating that she was talking to her friend at the beach reporting that she started  crying and did not feel that she was responding to certain situations appropriately.  Patient does identify that since the starting of Vyvanse  that she has been increasingly more agitated and irritated.  Patient has been advised to monitor these symptoms as well with follow-up in 2 weeks to determine whether or not that the patient may possibly be experiencing a mood disorder.  Patient was educated on the mood disorder and stating that potentially hypomania could be experienced due to the irritation agitation and that medication treatment must be modified in order to accommodate mood disorders.  Patient is recognized to be on a high dose of sertraline  as well as Vyvanse  that could be contraindicated for current investigation of mood disorder.  Patient has been educated and patient has been advised to follow up in 2 weeks with monitoring of symptoms to determine the best treatment course.  At this time patient states that the Vyvanse  is effective and no dose change will be completed at this time.  Patient endorses having 7 to 8 hours of sleep at night.  Patient states appetite has been controlled with less over eating and binge eating.  Patient is agreement with this treatment plan.  Patient with no other questions or concerns.  Patient will follow up in 2 weeks.  Patient denies SI, HI, AVH.   ICD-10-CM   1. Grieving  F43.21     2. Attention deficit hyperactivity disorder (ADHD), predominantly inattentive type  F90.0 lisdexamfetamine (VYVANSE ) 50 MG capsule    3. Moderate episode of recurrent major depressive disorder (HCC)  F33.1  4. GAD (generalized anxiety disorder)  F41.1     5. Moderate binge-eating disorder  F50.811       Past Psychiatric History:  Previous Psych Hospitalizations: Denies   Outpatient treatment: Currently being treated at [facility], and seeing a talk therapist weekly.    Medications Current: - Zoloft  150 mg once a day - Vyvanse  50 mg once a day - Hydroxyzine  10 mg 3 times a  day as needed for anxiety.  Medication Trials: - Adderall, states poor response as a child - Adderall XR, states poor response as a child - Strattera, states poor response as a child  Next Steps: - Patient reports increased irritability and mood with stimulant usage, patient to be considered for mood disorder. -Investigate mood disorder possibility.   Suicide & Violence: Denies SI, HI, AVH.   Psychotherapy: Currently participate and talk therapy once weekly   Legal: Denies  Past Medical History:  Past Medical History:  Diagnosis Date   ADD (attention deficit disorder)    Anxiety    Asthma    Back pain    Complication of anesthesia    Conscious sedation-extreme agitation   Fatty liver    Gallbladder problem    Generalized anxiety disorder    Heartburn    High blood pressure    High cholesterol    Irritable bowel syndrome    Joint pain    Multiple food allergies    Post partum depression    Prediabetes    Pregnancy induced hypertension    SOB (shortness of breath)     Past Surgical History:  Procedure Laterality Date   CESAREAN SECTION N/A 08/06/2020   Procedure: CESAREAN SECTION;  Surgeon: Meriam Stamp, MD;  Location: [facility];  Service: Obstetrics;  Laterality: N/A;   CHOLECYSTECTOMY     TONSILLECTOMY      Family Psychiatric History:  No known psychiatric history in the family   Family History:  Family History  Problem Relation Age of Onset   Cancer Mother        lymphoma   Hyperlipidemia Mother    Hypertension Mother    Heart disease Mother    Kidney disease Mother    Depression Mother    Anxiety disorder Mother    Sleep apnea Mother    Eating disorder Mother    Obesity Mother    Cancer Father        lung    Social History:  Social History   Socioeconomic History   Marital status: Married    Spouse name: Tracy Chang   Number of children: 1   Years of education: Not on file   Highest education level: Associate degree: occupational,  Scientist, product/process development, or vocational program  Occupational History   Occupation: Nurse  Tobacco Use   Smoking status: Never   Smokeless tobacco: Never  Vaping Use   Vaping status: Never Used  Substance and Sexual Activity   Alcohol use: Not Currently    Comment: SOCIAL   Drug use: Never   Sexual activity: Yes  Other Topics Concern   Not on file  Social History Narrative   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    Social Drivers of Health   Financial Resource Strain: Low Risk  (12/18/2022)   Received from [facility]   Overall Financial Resource Strain (CARDIA)    Difficulty of Paying Living Expenses: Not hard at all  Food Insecurity: No Food Insecurity (12/18/2022)   Received from [facility]   Hunger Vital Sign  Worried About Programme researcher, broadcasting/film/video in the Last Year: Never true    Ran Out of Food in the Last Year: Never true  Transportation Needs: No Transportation Needs (12/18/2022)   Received from [facility]   PRAPARE - Transportation    Lack of Transportation (Medical): No    Lack of Transportation (Non-Medical): No  Physical Activity: Not on file  Stress: Not on file  Social Connections: Unknown (12/10/2021)   Received from [facility]   Social Network    Social Network: Not on file    Allergies:  Allergies  Allergen Reactions   Augmentin  [Amoxicillin -Pot Clavulanate] Hives    States that she can take amoxicillin    Ceclor [Cefaclor] Hives   Demerol [Meperidine] Hives   Meloxicam  Swelling    Angioedema   Latex    Versed [Midazolam]     Panic attack while on versed   Compazine [Prochlorperazine] Anxiety    Metabolic Disorder Labs: Lab Results  Component Value Date   HGBA1C 5.9 08/25/2023   No results found for: "PROLACTIN" Lab Results  Component Value Date   CHOL 223 (H) 08/25/2023   TRIG 145.0 08/25/2023   HDL 50.10 08/25/2023   CHOLHDL 4 08/25/2023   VLDL 29.0 08/25/2023   LDLCALC 144 (H) 08/25/2023   LDLCALC 99 05/13/2022   Lab Results   Component Value Date   TSH 2.25 08/25/2023   TSH 3.590 12/03/2021    Therapeutic Level Labs: No results found for: "LITHIUM" No results found for: "VALPROATE" No results found for: "CBMZ"  Current Medications: Current Outpatient Medications  Medication Sig Dispense Refill   albuterol (VENTOLIN HFA) 108 (90 Base) MCG/ACT inhaler Inhale 1-2 puffs into the lungs every 6 (six) hours as needed for wheezing or shortness of breath.     ALPRAZolam  (XANAX ) 0.25 MG tablet Take 1 tablet (0.25 mg total) by mouth 2 (two) times daily as needed for anxiety. 20 tablet 0   buPROPion  (WELLBUTRIN  SR) 150 MG 12 hr tablet Take 1 tablet (150 mg total) by mouth 2 (two) times daily. 50 tablet 0   cetirizine (ZYRTEC) 10 MG tablet Take 10 mg by mouth daily.     fluticasone  (FLONASE ) 50 MCG/ACT nasal spray SPRAY 2 SPRAYS INTO EACH NOSTRIL EVERY DAY 16 mL 0   hydrOXYzine  (ATARAX ) 25 MG tablet Take 1 tablet (25 mg total) by mouth 3 (three) times daily as needed. 90 tablet 2   letrozole (FEMARA) 2.5 MG tablet Take by mouth.     lisdexamfetamine (VYVANSE ) 50 MG capsule Take 1 capsule (50 mg total) by mouth daily. 30 capsule 0   metFORMIN  (GLUCOPHAGE -XR) 500 MG 24 hr tablet Take 1 tablet (500 mg total) by mouth daily with breakfast. 30 tablet 0   Methylphenidate  HCl ER, PM, (JORNAY PM ) 40 MG CP24 Take 1 capsule (40 mg total) by mouth at bedtime. 30 capsule 0   montelukast  (SINGULAIR ) 10 MG tablet Take 1 tablet (10 mg total) by mouth at bedtime. 90 tablet 1   Prenatal Vit-Fe Fumarate-FA (PRENATAL MULTIVITAMIN) TABS tablet Take 1 tablet by mouth daily at 12 noon.     Semaglutide -Weight Management (WEGOVY ) 0.25 MG/0.5ML SOAJ Inject 0.25 mg into the skin once a week. 2 mL 0   sertraline  (ZOLOFT ) 100 MG tablet Take 1.5 tablets (150 mg total) by mouth daily. 90 tablet 0   traZODone (DESYREL) 50 MG tablet Take 50 mg by mouth daily.     valACYclovir (VALTREX) 1000 MG tablet Take 1,000 mg by mouth  as needed.     Vitamin D ,  Ergocalciferol , (DRISDOL ) 1.25 MG (50000 UNIT) CAPS capsule Take 1 capsule (50,000 Units total) by mouth every 7 (seven) days. 8 capsule 0   No current facility-administered medications for this visit.     Musculoskeletal: Strength & Muscle Tone: within normal limits Gait & Station: normal Patient leans: N/A  Psychiatric Specialty Exam: Review of Systems  Constitutional: Negative.   HENT: Negative.    Eyes: Negative.   Respiratory: Negative.    Cardiovascular: Negative.   Gastrointestinal: Negative.   Endocrine: Negative.   Genitourinary: Negative.   Musculoskeletal: Negative.   Allergic/Immunologic: Negative.   Neurological: Negative.   Hematological: Negative.   Psychiatric/Behavioral:  Positive for agitation and dysphoric mood. The patient is nervous/anxious.     There were no vitals taken for this visit.There is no height or weight on file to calculate BMI.  General Appearance: Well Groomed  Eye Contact:  Good  Speech:  Clear and Coherent  Volume:  Normal  Mood:  Euthymic  Affect:  Appropriate  Thought Process:  Coherent  Orientation:  Full (Time, Place, and Person)  Thought Content: Logical   Suicidal Thoughts:  No  Homicidal Thoughts:  No  Memory:  Immediate;   Good Recent;   Good Remote;   Good  Judgement:  Good  Insight:  Good  Psychomotor Activity:  Normal  Concentration:  Concentration: Good and Attention Span: Good  Recall:  Good  Fund of Knowledge: Good  Language: Good  Akathisia:  No  Handed:  Right  AIMS (if indicated):   Assets:  Communication Skills Desire for Improvement Financial Resources/Insurance Housing  ADL's:  Intact  Cognition: WNL  Sleep:  Good   Screenings: GAD-7    Flowsheet Row Office Visit from 10/16/2023 in [facility] Office Visit from 08/25/2023 in [facility]  Total GAD-7 Score 9 18      PHQ2-9    Flowsheet Row Office Visit from 12/01/2023 in [facility] Office Visit from 10/29/2023 in [facility] Office Visit from 10/16/2023 in [facility] Office Visit from 08/25/2023 in [facility] [place] Office Visit from 12/03/2021 in [facility] Total Score 4 3 2 6 3   PHQ-9 Total Score 6 11 6 17 11       Flowsheet Row Office Visit from 12/01/2023 [facility] Visit from 10/29/2023 [facility] from 10/10/2023 in [facility]   C-SSRS RISK CATEGORY No Risk No Risk No Risk        Assessment and Plan:  Assessment - Diagnosis: Grieving [F43.21]  2. Severe episode of recurrent major depressive disorder, without psychotic features  3. GAD (generalized anxiety disorder) [F41.1]  4. Attention deficit hyperactivity disorder (ADHD), predominantly inattentive type [F90.0] , Childhood onset. 5.  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  - Risk Factors: Suicidal risk, stimulant use disorder risk  Plan - Medications:  Continue sertraline  150 mg p.o. once daily prescribed by midwife, for MDD Stop Garen Juneau, due to poor response Start Vyvanse  50mg , for ADHD with agreement to get neuropsych testing to be scheduled, drug test  to be completed today.  Decrease Hydroxyzine  to 10mg  TID as needed for anxiety. Pt educated to not taking medication - Psychotherapy: Recommended for weekly talk therapy, patient currently with therapist Carlee Charters and satisfied with therapy. - Education: Patient educated on ADHD symptoms as well as medication management.  Patient also educated on Xanax   long-term use and was advised to taper in which she was in agreement.  Patient educated on medications, purpose, usage, side effects and adverse reactions and notified to reach out to clinic should she have any worsening symptoms. - Follow-Up: Patient will follow-up in 1 month for medication monitoring - Referrals: No referrals - Safety Planning: Patient safety plan stating that she will call 911 or go to the emergency department should she have suicidal ideation with or without a plan.  Patient also states no firearms within the home.  Patient is in agreement with safety planning.     Patient/Guardian was advised Release of Information must be obtained prior to any record release in order to collaborate their care with an outside provider. Patient/Guardian was advised if they have not already done so to contact the registration department to sign all necessary forms in order for us  to release information regarding their care.   Consent: Patient/Guardian gives verbal consent for treatment and assignment of benefits for services provided during this visit. Patient/Guardian expressed understanding and agreed to proceed.    Arlana Labor, NP 12/31/2023, 9:02 AM

## 2024-01-14 ENCOUNTER — Telehealth (INDEPENDENT_AMBULATORY_CARE_PROVIDER_SITE_OTHER): Admitting: Psychiatry

## 2024-01-14 DIAGNOSIS — F900 Attention-deficit hyperactivity disorder, predominantly inattentive type: Secondary | ICD-10-CM | POA: Diagnosis not present

## 2024-01-14 DIAGNOSIS — F331 Major depressive disorder, recurrent, moderate: Secondary | ICD-10-CM | POA: Diagnosis not present

## 2024-01-14 DIAGNOSIS — F411 Generalized anxiety disorder: Secondary | ICD-10-CM

## 2024-01-14 DIAGNOSIS — F4321 Adjustment disorder with depressed mood: Secondary | ICD-10-CM | POA: Diagnosis not present

## 2024-01-14 DIAGNOSIS — F50811 Binge eating disorder, moderate: Secondary | ICD-10-CM | POA: Diagnosis not present

## 2024-01-14 DIAGNOSIS — F4310 Post-traumatic stress disorder, unspecified: Secondary | ICD-10-CM | POA: Diagnosis not present

## 2024-01-14 NOTE — Progress Notes (Addendum)
 [facility] MD/PA/NP OP Progress Note  01/14/2024 9:30 AM Tracy Chang  MRN:  [id number]  Chief Complaint: Routine follow-up  HPI: 38 year old female presents [facility] for follow-up.  Patient reports that she is still having intrusive thoughts in relation to finding her mother in her bed passed away when she found her.  Patient reports that her symptoms are improving greatly reporting decrease agitation and irritation with her family while caring for them as well as at work.  Patient reports the medications are helping manage her systems with concerns about possibly starting Xanax  again in which she was reminded of our medical group I will see that we will not be prescribing benzos as her GAD-7 score does not reflect the indication for anxiety medications.  Patient was also educated that our goal initially was to get off of benzos in which she is stating that she is not happy to use hydroxyzine  and is able to cope with anxiety symptoms.  Patient is reporting that the Zoloft  is helping manage her depression as well as Vyvanse  helping her get through her day at work stating that the 50 mg dose is perfect for her and she states that she needs no alterations to that medication.  Based on this assessment interview patient will continue medications as prescribed and is encouraged to call clinic should her symptoms get worse before her next follow-up visit.  Patient with no questions or concerns.  Patient is in agreement with treatment plan.  Patient denies SI, HI, AVH.  Patient is to follow up in 3 months due to stability of medications.  Patient has been educated to reach out to the provider should symptoms or concerns, before the next visit. Visit Diagnosis:    ICD-10-CM   1. Grieving  F43.21     2. Attention deficit hyperactivity disorder (ADHD), predominantly inattentive type  F90.0     3. Moderate episode of recurrent major depressive disorder (HCC)  F33.1     4. GAD (generalized anxiety disorder)  F41.1      5. Moderate binge-eating disorder  F50.811       Past Psychiatric History:  Previous Psych Hospitalizations: Denies   Outpatient treatment: Currently being treated at [facility], and seeing a talk therapist weekly.    Medications Current: - Zoloft  150 mg once a day - Vyvanse  50 mg once a day - Hydroxyzine  10 mg 3 times a day as needed for anxiety.   Medication Trials: - Adderall, states poor response as a child - Adderall XR, states poor response as a child - Strattera, states poor response as a child   Next Steps: - Patient reports increased irritability and mood with stimulant usage, patient to be considered for mood disorder. -Investigate mood disorder possibility.   Suicide & Violence: Denies SI, HI, AVH.   Psychotherapy: Currently participate and talk therapy once weekly   Legal: Denies  Past Medical History:  Past Medical History:  Diagnosis Date   ADD (attention deficit disorder)    Anxiety    Asthma    Back pain    Complication of anesthesia    Conscious sedation-extreme agitation   Fatty liver    Gallbladder problem    Generalized anxiety disorder    Heartburn    High blood pressure    High cholesterol    Irritable bowel syndrome    Joint pain    Multiple food allergies    Post partum depression    Prediabetes    Pregnancy induced hypertension  SOB (shortness of breath)     Past Surgical History:  Procedure Laterality Date   CESAREAN SECTION N/A 08/06/2020   Procedure: CESAREAN SECTION;  Surgeon: Meriam Stamp, MD;  Location: [facility];  Service: Obstetrics;  Laterality: N/A;   CHOLECYSTECTOMY     TONSILLECTOMY      Family Psychiatric History:  No known psychiatric history in the family   Family History:  Family History  Problem Relation Age of Onset   Cancer Mother        lymphoma   Hyperlipidemia Mother    Hypertension Mother    Heart disease Mother    Kidney disease Mother    Depression Mother    Anxiety disorder Mother    Sleep  apnea Mother    Eating disorder Mother    Obesity Mother    Cancer Father        lung    Social History:  Social History   Socioeconomic History   Marital status: Married    Spouse name: Jaisa Defino   Number of children: 1   Years of education: Not on file   Highest education level: Associate degree: occupational, Scientist, product/process development, or vocational program  Occupational History   Occupation: Nurse  Tobacco Use   Smoking status: Never   Smokeless tobacco: Never  Vaping Use   Vaping status: Never Used  Substance and Sexual Activity   Alcohol use: Not Currently    Comment: SOCIAL   Drug use: Never   Sexual activity: Yes  Other Topics Concern   Not on file  Social History Narrative   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    Social Drivers of Health   Financial Resource Strain: Low Risk  (12/18/2022)   Received from [facility]   Overall Financial Resource Strain (CARDIA)    Difficulty of Paying Living Expenses: Not hard at all  Food Insecurity: No Food Insecurity (12/18/2022)   [facility]   Hunger Vital Sign    Worried About Running Out of Food in the Last Year: Never true    Ran Out of Food in the Last Year: Never true  Transportation Needs: No Transportation Needs (12/18/2022)   Received from [facility] - [facility]    Lack of Transportation (Medical): No    Lack of Transportation (Non-Medical): No  Physical Activity: Not on file  Stress: Not on file  Social Connections: Unknown (12/10/2021)   [facility] from [facility]   Social Network    Social Network: Not on file    Allergies:  Allergies  Allergen Reactions   Augmentin  [Amoxicillin -Pot Clavulanate] Hives    States that she can take amoxicillin    Ceclor [Cefaclor] Hives   Demerol [Meperidine] Hives   Meloxicam  Swelling    Angioedema   Latex    Versed [Midazolam]     Panic attack while on versed   Compazine [Prochlorperazine] Anxiety    Metabolic Disorder  Labs: Lab Results  Component Value Date   HGBA1C 5.9 08/25/2023   No results found for: "PROLACTIN" Lab Results  Component Value Date   CHOL 223 (H) 08/25/2023   TRIG 145.0 08/25/2023   HDL 50.10 08/25/2023   CHOLHDL 4 08/25/2023   VLDL 29.0 08/25/2023   LDLCALC 144 (H) 08/25/2023   LDLCALC 99 05/13/2022   Lab Results  Component Value Date   TSH 2.25 08/25/2023   TSH 3.590 12/03/2021    Therapeutic Level Labs: No results found for: "LITHIUM" No results  found for: "VALPROATE" No results found for: "CBMZ"  Current Medications: Current Outpatient Medications  Medication Sig Dispense Refill   albuterol (VENTOLIN HFA) 108 (90 Base) MCG/ACT inhaler Inhale 1-2 puffs into the lungs every 6 (six) hours as needed for wheezing or shortness of breath.     ALPRAZolam  (XANAX ) 0.25 MG tablet Take 1 tablet (0.25 mg total) by mouth 2 (two) times daily as needed for anxiety. 20 tablet 0   buPROPion  (WELLBUTRIN  SR) 150 MG 12 hr tablet Take 1 tablet (150 mg total) by mouth 2 (two) times daily. 50 tablet 0   cetirizine (ZYRTEC) 10 MG tablet Take 10 mg by mouth daily.     fluticasone  (FLONASE ) 50 MCG/ACT nasal spray SPRAY 2 SPRAYS INTO EACH NOSTRIL EVERY DAY 16 mL 0   hydrOXYzine  (ATARAX ) 25 MG tablet Take 1 tablet (25 mg total) by mouth 3 (three) times daily as needed. 90 tablet 2   letrozole (FEMARA) 2.5 MG tablet Take by mouth.     lisdexamfetamine (VYVANSE ) 50 MG capsule Take 1 capsule (50 mg total) by mouth daily. 30 capsule 0   metFORMIN  (GLUCOPHAGE -XR) 500 MG 24 hr tablet Take 1 tablet (500 mg total) by mouth daily with breakfast. 30 tablet 0   Methylphenidate  HCl ER, PM, (JORNAY PM ) 40 MG CP24 Take 1 capsule (40 mg total) by mouth at bedtime. 30 capsule 0   montelukast  (SINGULAIR ) 10 MG tablet Take 1 tablet (10 mg total) by mouth at bedtime. 90 tablet 1   Prenatal Vit-Fe Fumarate-FA (PRENATAL MULTIVITAMIN) TABS tablet Take 1 tablet by mouth daily at 12 noon.     Semaglutide -Weight  Management (WEGOVY ) 0.25 MG/0.5ML SOAJ Inject 0.25 mg into the skin once a week. 2 mL 0   sertraline  (ZOLOFT ) 100 MG tablet Take 1.5 tablets (150 mg total) by mouth daily. 90 tablet 0   traZODone (DESYREL) 50 MG tablet Take 50 mg by mouth daily.     valACYclovir (VALTREX) 1000 MG tablet Take 1,000 mg by mouth as needed.     Vitamin D , Ergocalciferol , (DRISDOL ) 1.25 MG (50000 UNIT) CAPS capsule Take 1 capsule (50,000 Units total) by mouth every 7 (seven) days. 8 capsule 0   No current facility-administered medications for this visit.     Musculoskeletal: Strength & Muscle Tone: within normal limits Gait & Station: normal Patient leans: N/A  Psychiatric Specialty Exam: Review of Systems  Constitutional: Negative.   HENT: Negative.    Eyes: Negative.   Respiratory: Negative.    Cardiovascular: Negative.   Gastrointestinal: Negative.   Endocrine: Negative.   Genitourinary: Negative.   Musculoskeletal: Negative.   Skin: Negative.   Allergic/Immunologic: Negative.   Neurological: Negative.   Hematological: Negative.   Psychiatric/Behavioral:  Positive for dysphoric mood.        Intrusive thoughts    There were no vitals taken for this visit.There is no height or weight on file to calculate BMI.  General Appearance: Well Groomed  Eye Contact:  Good  Speech:  Clear and Coherent  Volume:  Normal  Mood:  Depressed  Affect:  Depressed  Thought Process:  Coherent  Orientation:  Full (Time, Place, and Person)  Thought Content: Logical   Suicidal Thoughts:  No  Homicidal Thoughts:  No  Memory:  Immediate;   Good Recent;   Good Remote;   Good  Judgement:  Good  Insight:  Good  Psychomotor Activity:  Normal  Concentration:  Concentration: Good and Attention Span: Good  Recall:  Good  Fund of  Knowledge: Good  Language: Good  Akathisia:  No  Handed:  Right  AIMS (if indicated):   Assets:  Desire for Improvement Financial Resources/Insurance Housing  ADL's:  Intact   Cognition: WNL  Sleep:  Good   Screenings: GAD-7    Flowsheet Row Video Visit from 12/31/2023 in [facility] Office Visit from 10/16/2023 in [facility] Office Visit from 08/25/2023 in [facility]  Total GAD-7 Score 11 9 18       PHQ2-9    Flowsheet Row Video Visit from 12/31/2023 in [facility] Office Visit from 12/01/2023 in [facility] Office Visit from 10/29/2023 in [facility] Office Visit from 10/16/2023 in [facility] Visit from 08/25/2023 in [facility] [place]  PHQ-2 Total Score 2 4 3 2 6   PHQ-9 Total Score 14 6 11 6 17       Flowsheet Row Video Visit from 12/31/2023 in [facility] Office Visit from 12/01/2023 in [facility] Office Visit from 10/29/2023 in [facility]  C-SSRS RISK CATEGORY No Risk No Risk No Risk        Assessment and Plan:  Assessment - Diagnosis: Grieving [F43.21]  2. Severe episode of recurrent major depressive disorder, without psychotic features  3. GAD (generalized anxiety disorder) [F41.1]  4. Attention deficit hyperactivity disorder (ADHD), predominantly inattentive type [F90.0] , Childhood onset. 5.  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  - Risk Factors: Suicidal risk, stimulant use disorder risk  Plan - Medications:  Continue sertraline  150 mg p.o. once daily prescribed by midwife, for MDD Continue Vyvanse  50mg , for ADHD with agreement to get  neuropsych testing to be scheduled, drug test to be completed today.  Continue Hydroxyzine  to 10mg  TID as needed for anxiety. Pt educated to not taking medication - Psychotherapy: Recommended for weekly talk therapy, patient currently with therapist Carlee Charters and satisfied with therapy. - Education: Patient educated on ADHD symptoms as well as medication management.  Patient also educated on Xanax  long-term use and was advised to taper in which she was in agreement.  Patient educated on medications, purpose, usage, side effects and adverse reactions and notified to reach out to clinic should she have any worsening symptoms. - Follow-Up: Patient will follow-up in 3 month for medication monitoring - Referrals: No referrals - Safety Planning: Patient safety plan stating that she will call 911 or go to the emergency department should she have suicidal ideation with or without a plan.  Patient also states no firearms within the home.  Patient is in agreement with safety planning.    Patient/Guardian was advised Release of Information must be obtained prior to any record release in order to collaborate their care with an outside provider. Patient/Guardian was advised if they have not already done so to contact the registration department to sign all necessary forms in order for us  to release information regarding their care.   Consent: Patient/Guardian gives verbal consent for treatment and assignment of benefits for services provided during this visit. Patient/Guardian expressed understanding and agreed to proceed.    Arlana Labor, NP 01/14/2024, 9:30 AM

## 2024-01-19 ENCOUNTER — Encounter: Payer: Self-pay | Admitting: Nurse Practitioner

## 2024-01-20 NOTE — Telephone Encounter (Signed)
 Can we get her in quickly for an office visit?

## 2024-01-21 NOTE — Telephone Encounter (Signed)
 Called and spoke with patient. Patient stated unable to come into office for days or times available so she will go to [facility] if needed.

## 2024-01-22 NOTE — Telephone Encounter (Signed)
 Called  pt and schedule a appt for COVID f/u

## 2024-01-26 ENCOUNTER — Ambulatory Visit: Admitting: Nurse Practitioner

## 2024-01-26 ENCOUNTER — Ambulatory Visit: Payer: Self-pay | Admitting: Nurse Practitioner

## 2024-01-27 DIAGNOSIS — F4310 Post-traumatic stress disorder, unspecified: Secondary | ICD-10-CM | POA: Diagnosis not present

## 2024-02-02 ENCOUNTER — Telehealth: Admitting: Physician Assistant

## 2024-02-02 ENCOUNTER — Encounter

## 2024-02-02 DIAGNOSIS — J019 Acute sinusitis, unspecified: Secondary | ICD-10-CM

## 2024-02-02 DIAGNOSIS — B9689 Other specified bacterial agents as the cause of diseases classified elsewhere: Secondary | ICD-10-CM | POA: Diagnosis not present

## 2024-02-02 MED ORDER — DOXYCYCLINE HYCLATE 100 MG PO TABS
100.0000 mg | ORAL_TABLET | Freq: Two times a day (BID) | ORAL | 0 refills | Status: DC
Start: 2024-02-02 — End: 2024-06-23

## 2024-02-02 MED ORDER — FLUCONAZOLE 150 MG PO TABS: ORAL_TABLET | ORAL | 0 refills | Status: AC

## 2024-02-02 NOTE — Progress Notes (Signed)

## 2024-02-02 NOTE — Progress Notes (Signed)
 I have spent 5 minutes in review of e-visit questionnaire, review and updating patient chart, medical decision making and response to patient.   Piedad Climes, PA-C

## 2024-02-02 NOTE — Addendum Note (Signed)
 Addended by: GLADIS ELSIE BROCKS on: 02/02/2024 11:55 AM   Modules accepted: Orders

## 2024-02-04 ENCOUNTER — Other Ambulatory Visit (HOSPITAL_COMMUNITY): Payer: Self-pay

## 2024-02-04 MED ORDER — SERTRALINE HCL 100 MG PO TABS
150.0000 mg | ORAL_TABLET | Freq: Every day | ORAL | 0 refills | Status: DC
  Filled 2024-02-04: qty 90, 60d supply, fill #0

## 2024-02-18 DIAGNOSIS — F4310 Post-traumatic stress disorder, unspecified: Secondary | ICD-10-CM | POA: Diagnosis not present

## 2024-02-23 DIAGNOSIS — F4310 Post-traumatic stress disorder, unspecified: Secondary | ICD-10-CM | POA: Diagnosis not present

## 2024-03-02 ENCOUNTER — Emergency Department (HOSPITAL_COMMUNITY)
Admission: EM | Admit: 2024-03-02 | Discharge: 2024-03-02 | Disposition: A | Discharge status: Home / Self Care | Attending: Emergency Medicine | Admitting: Emergency Medicine

## 2024-03-02 ENCOUNTER — Encounter (HOSPITAL_COMMUNITY): Payer: Self-pay

## 2024-03-02 DIAGNOSIS — Z91040 Latex allergy status: Secondary | ICD-10-CM | POA: Diagnosis not present

## 2024-03-02 DIAGNOSIS — T63441A Toxic effect of venom of bees, accidental (unintentional), initial encounter: Secondary | ICD-10-CM | POA: Diagnosis not present

## 2024-03-02 DIAGNOSIS — Z7951 Long term (current) use of inhaled steroids: Secondary | ICD-10-CM | POA: Diagnosis not present

## 2024-03-02 DIAGNOSIS — J45909 Unspecified asthma, uncomplicated: Secondary | ICD-10-CM | POA: Insufficient documentation

## 2024-03-02 DIAGNOSIS — T7840XA Allergy, unspecified, initial encounter: Secondary | ICD-10-CM

## 2024-03-02 DIAGNOSIS — R55 Syncope and collapse: Secondary | ICD-10-CM | POA: Insufficient documentation

## 2024-03-02 DIAGNOSIS — T782XXA Anaphylactic shock, unspecified, initial encounter: Secondary | ICD-10-CM | POA: Insufficient documentation

## 2024-03-02 LAB — HCG, SERUM, QUALITATIVE: Preg, Serum: NEGATIVE

## 2024-03-02 MED ORDER — PREDNISONE 20 MG PO TABS: 40.0000 mg | ORAL_TABLET | Freq: Every day | ORAL | 0 refills | Status: AC

## 2024-03-02 MED ORDER — EPINEPHRINE 0.3 MG/0.3ML IJ SOAJ: 0.3000 mg | INTRAMUSCULAR | 0 refills | Status: AC | PRN

## 2024-03-02 MED ORDER — EPINEPHRINE 0.3 MG/0.3ML IJ SOAJ
0.3000 mg | Freq: Once | INTRAMUSCULAR | Status: AC
  Administered 2024-03-02: 0.3 mg via INTRAMUSCULAR
  Filled 2024-03-02: qty 0.3

## 2024-03-02 MED ORDER — SODIUM CHLORIDE 0.9 % IV BOLUS
1000.0000 mL | Freq: Once | INTRAVENOUS | Status: AC
  Administered 2024-03-02: 1000 mL via INTRAVENOUS

## 2024-03-02 NOTE — ED Triage Notes (Signed)
 Pt arrived via wheelchair from [facility], was working and started having some SOB, dizziness, anxiety. Pt is allergic to bees, got stung yesterday, had taken some benadryl , hydroxyzine  and pepcid when she was stung with some relief. Sx worsening this morning.   Given 25mg  PO Benadryl , 125mg  solumedrol and 10 mg PO pepcid.States some relief, but still feels dizzy right before arrival to ED. Per rapid response nurse, pt had syncopal event on the way to ED. Awake and able to answer questions during triage. Arrived on [address] [place] for comfort.   100% on RA in triage

## 2024-03-02 NOTE — ED Provider Notes (Signed)
 [place] EMERGENCY DEPARTMENT AT [facility] Provider Note   CSN: [id number] Arrival date & time: 03/02/24  1051     Patient presents with: Allergic Reaction   Tracy Chang is a 38 y.o. female.   The history is provided by the patient, medical records and a friend. No language interpreter was used.  Allergic Reaction Presenting symptoms: difficulty breathing, rash and swelling   Presenting symptoms: no wheezing   Severity:  Moderate Duration:  1 day Prior allergic episodes:  No prior episodes Context: insect bite/sting   Relieved by:  Nothing Worsened by:  Nothing Ineffective treatments:  Antihistamines and steroids      Prior to Admission medications   Medication Sig Start Date End Date Taking? Authorizing Provider  albuterol (VENTOLIN HFA) 108 (90 Base) MCG/ACT inhaler Inhale 1-2 puffs into the lungs every 6 (six) hours as needed for wheezing or shortness of breath.    [provider]  ALPRAZolam  (XANAX ) 0.25 MG tablet Take 1 tablet (0.25 mg total) by mouth 2 (two) times daily as needed for anxiety. 10/29/23   Saucier, Dorn Ruth, NP  buPROPion  (WELLBUTRIN  SR) 150 MG 12 hr tablet Take 1 tablet (150 mg total) by mouth 2 (two) times daily. 10/29/23   Saucier, Dorn Ruth, NP  cetirizine (ZYRTEC) 10 MG tablet Take 10 mg by mouth daily.    [provider]  doxycycline  (VIBRA -TABS) 100 MG tablet Take 1 tablet (100 mg total) by mouth 2 (two) times daily. 02/02/24   Gladis Elsie BROCKS, PA-C  fluconazole  (DIFLUCAN ) 150 MG tablet Take 1 tablet PO once. Repeat in 3 days if needed. 02/02/24   Gladis Elsie BROCKS, PA-C  fluticasone  (FLONASE ) 50 MCG/ACT nasal spray SPRAY 2 SPRAYS INTO EACH NOSTRIL EVERY DAY 10/05/23   Wendee Lynwood HERO, NP  hydrOXYzine  (ATARAX ) 25 MG tablet Take 1 tablet (25 mg total) by mouth 3 (three) times daily as needed. 12/01/23   Saucier, Dorn Ruth, NP  letrozole (FEMARA) 2.5 MG tablet Take by mouth. 09/15/23   [provider]   lisdexamfetamine (VYVANSE ) 50 MG capsule Take 1 capsule (50 mg total) by mouth daily. 12/31/23   Saucier, Dorn Ruth, NP  metFORMIN  (GLUCOPHAGE -XR) 500 MG 24 hr tablet Take 1 tablet (500 mg total) by mouth daily with breakfast. 05/13/22   [place] Adelita PENNER, MD  Methylphenidate  HCl ER, PM, (JORNAY PM ) 40 MG CP24 Take 1 capsule (40 mg total) by mouth at bedtime. 10/29/23   Saucier, Dorn Ruth, NP  montelukast  (SINGULAIR ) 10 MG tablet Take 1 tablet (10 mg total) by mouth at bedtime. 12/10/23   Wendee Lynwood HERO, NP  Prenatal Vit-Fe Fumarate-FA (PRENATAL MULTIVITAMIN) TABS tablet Take 1 tablet by mouth daily at 12 noon.    [provider]  Semaglutide -Weight Management (WEGOVY ) 0.25 MG/0.5ML SOAJ Inject 0.25 mg into the skin once a week. 10/12/23     sertraline  (ZOLOFT ) 100 MG tablet Take 1.5 tablets (150 mg total) by mouth daily. 02/04/24     traZODone (DESYREL) 50 MG tablet Take 50 mg by mouth daily. 08/11/23   [provider]  valACYclovir (VALTREX) 1000 MG tablet Take 1,000 mg by mouth as needed.    [provider]  Vitamin D , Ergocalciferol , (DRISDOL ) 1.25 MG (50000 UNIT) CAPS capsule Take 1 capsule (50,000 Units total) by mouth every 7 (seven) days. 10/19/23   Wendee Lynwood HERO, NP    Allergies: Augmentin  [amoxicillin -pot clavulanate], Ceclor [cefaclor], Demerol [meperidine], Meloxicam , Bee venom, Latex, Versed [midazolam], and Compazine [prochlorperazine]  Review of Systems  Constitutional:  Positive for fatigue. Negative for chills and fever.  HENT:  Negative for congestion.   Eyes:  Negative for visual disturbance.  Respiratory:  Positive for chest tightness and shortness of breath. Negative for cough and wheezing.   Cardiovascular:  Positive for chest pain. Negative for palpitations.  Gastrointestinal:  Positive for diarrhea and nausea. Negative for abdominal pain, constipation and vomiting.  Genitourinary:  Negative for dysuria and flank pain.  Musculoskeletal:   Negative for back pain, neck pain and neck stiffness.  Skin:  Positive for rash.  Neurological:  Positive for syncope and light-headedness. Negative for headaches.  Psychiatric/Behavioral:  Negative for agitation and confusion.   All other systems reviewed and are negative.   Updated Vital Signs BP (!) 139/97   Pulse 83   Temp 97.6 F (36.4 C) (Oral)   Resp 12   LMP 02/10/2024   SpO2 100%   Physical Exam Vitals and nursing note reviewed.  Constitutional:      General: She is not in acute distress.    Appearance: She is well-developed. She is not ill-appearing, toxic-appearing or diaphoretic.  HENT:     Head: Normocephalic and atraumatic.     Nose: No congestion or rhinorrhea.     Mouth/Throat:     Mouth: Mucous membranes are dry.     Pharynx: No oropharyngeal exudate or posterior oropharyngeal erythema.  Eyes:     Extraocular Movements: Extraocular movements intact.     Conjunctiva/sclera: Conjunctivae normal.     Pupils: Pupils are equal, round, and reactive to light.  Cardiovascular:     Rate and Rhythm: Normal rate and regular rhythm.     Heart sounds: No murmur heard. Pulmonary:     Effort: Pulmonary effort is normal. No respiratory distress.     Breath sounds: Normal breath sounds. No wheezing, rhonchi or rales.  Chest:     Chest wall: No tenderness.  Abdominal:     General: Abdomen is flat.     Palpations: Abdomen is soft.     Tenderness: There is no abdominal tenderness. There is no right CVA tenderness, left CVA tenderness, guarding or rebound.  Musculoskeletal:        General: No swelling or tenderness.     Cervical back: Neck supple. No tenderness.     Right lower leg: No edema.     Left lower leg: No edema.  Skin:    General: Skin is warm and dry.     Capillary Refill: Capillary refill takes less than 2 seconds.     Findings: No erythema or rash.  Neurological:     Mental Status: She is alert.  Psychiatric:        Mood and Affect: Mood normal.      (all labs ordered are listed, but only abnormal results are displayed) Labs Reviewed  HCG, SERUM, QUALITATIVE    EKG: EKG Interpretation Date/Time:  Wednesday March 02 2024 12:10:31 EDT Ventricular Rate:  80 PR Interval:  156 QRS Duration:  103 QT Interval:  410 QTC Calculation: 473 R Axis:   41  Text Interpretation: Sinus rhythm Baseline wander in lead(s) I when compared to prior, similar appearance No STEMI Confirmed by Ginger Barefoot ([id number]) on 03/02/2024 12:16:25 PM  Radiology: No results found.   Procedures   Medications Ordered in the ED  EPINEPHrine  (EPI-PEN) injection 0.3 mg (0.3 mg Intramuscular Given 03/02/24 1212)  sodium chloride  0.9 % bolus 1,000 mL (0 mLs Intravenous Stopped 03/02/24 1522)  Medical Decision Making Amount and/or Complexity of Data Reviewed Labs: ordered.  Risk Prescription drug management.    Tracy Chang is a 38 y.o. female with a past medical history significant for asthma, anxiety, cholesteremia, and previous cholecystectomy who presents for syncope and allergic reaction symptoms recorded to patient, yesterday evening about 5:30 PM she was outside in a honey bee stung her in her left hand.  She reports immediate onset of severe pain and started having some swelling.  She says that she immediately took some Benadryl  and Pepcid and even took doxycycline  as she was worried about possible infection from it.  She got the stinger out and did not any residual stinger.  She reports she was able to get some sleep and slept well but then this morning woke up and still had some swelling in the left hand.  She started having some nausea but no vomiting and has had episodes of diarrhea.  She was feeling some facial tingling on her lips and felt tightness in her throat.  She got to work and it started to worsen.  She also had a lightheaded and near syncopal episode and actually passed out.  She was given more  Benadryl , Pepcid, and Solu-Medrol .  She says what is starting to feel better but still feels some symptoms.  On my exam, vital signs are reassuring.  She had clear breath sounds without wheezing and no stridor on my exam.  Chest and abdomen nontender.  Bowel sounds were appreciated.  No focal neurologic deficits.  Patient resting but is still complaining of the shortness of breath, throat feeling tight, diarrhea, headedness, tingling.  On her hand she did have some swelling in the hand but there was no evidence of residual stinger.  He had intact grip strength sensation and pulses.  We had a shared decision-making conversation and agreed to give epinephrine .  Patient has already had steroids antihistamines and things are starting to improve but as they are still persistent we agreed to escalate given the airway symptoms.  Will give EpiPen  and then watch her for 4 hours.  If she is feeling better after, anticipate discharge with burst of steroids and prescription for EpiPen 's.  Patient agrees with this plan.   Anticipate reassessment and about 4 PM to discuss likely discharge.  Patient feeling much better now and feels back to her normal self.  She would like to go home and does not want to wait longer.  Will print prescription for steroids and EpiPen  and she will follow-up with her PCP.  She agrees to take an histamines and watch her symptoms.  She no other questions or concerns and was discharged in good condition.       Final diagnoses:  Allergic reaction, initial encounter  Anaphylaxis, initial encounter  Syncope, unspecified syncope type  Bee sting, accidental or unintentional, initial encounter    ED Discharge Orders          Ordered    predniSONE  (DELTASONE ) 20 MG tablet  Daily        03/02/24 1526    EPINEPHrine  0.3 mg/0.3 mL IJ SOAJ injection  As needed        03/02/24 1526            Clinical Impression: 1. Allergic reaction, initial encounter   2. Anaphylaxis,  initial encounter   3. Syncope, unspecified syncope type   4. Bee sting, accidental or unintentional, initial encounter     Disposition: Discharge  Condition: Good  I  have discussed the results, Dx and Tx plan with the pt(& family if present). He/she/they expressed understanding and agree(s) with the plan. Discharge instructions discussed at great length. Strict return precautions discussed and pt &/or family have verbalized understanding of the instructions. No further questions at time of discharge.    New Prescriptions   EPINEPHRINE  0.3 MG/0.3 ML IJ SOAJ INJECTION    Inject 0.3 mg into the muscle as needed for anaphylaxis.   PREDNISONE  (DELTASONE ) 20 MG TABLET    Take 2 tablets (40 mg total) by mouth daily for 5 days.    Follow Up: Wendee Lynwood HERO, NP [address] [place] [address] [phone]     [facility] Emergency Department at [facility] [address] [place]  [address] [phone]          Pamala Hayman, Lonni PARAS, MD 03/02/24 1537

## 2024-03-02 NOTE — Discharge Instructions (Addendum)
 Your history, exam and workup today are consistent with anaphylactic action to the bee sting that continued to worsen today leading to your syncopal episode and symptoms.  After epinephrine  steroids antihistamines we feel you are much improved and have proven stability for over 4 hours.  We had a shared decision-making conversation with you and feel you are now safe for discharge home to follow-up with your primary doctor.  Please take the burst of steroids for the next 5 days starting tomorrow and please rest and stay hydrated.  Please fill the prescription for EpiPen  to have on hand.  If any symptoms change or worsen acutely, please return to the nearest emergency department.

## 2024-03-09 ENCOUNTER — Other Ambulatory Visit (HOSPITAL_COMMUNITY): Payer: Self-pay

## 2024-03-10 DIAGNOSIS — F4310 Post-traumatic stress disorder, unspecified: Secondary | ICD-10-CM | POA: Diagnosis not present

## 2024-03-25 ENCOUNTER — Other Ambulatory Visit: Payer: Self-pay

## 2024-03-25 ENCOUNTER — Other Ambulatory Visit (HOSPITAL_COMMUNITY): Payer: Self-pay

## 2024-03-29 DIAGNOSIS — Z01419 Encounter for gynecological examination (general) (routine) without abnormal findings: Secondary | ICD-10-CM | POA: Diagnosis not present

## 2024-03-29 DIAGNOSIS — Z8619 Personal history of other infectious and parasitic diseases: Secondary | ICD-10-CM | POA: Diagnosis not present

## 2024-03-29 DIAGNOSIS — Z3202 Encounter for pregnancy test, result negative: Secondary | ICD-10-CM | POA: Diagnosis not present

## 2024-03-29 DIAGNOSIS — Z1331 Encounter for screening for depression: Secondary | ICD-10-CM | POA: Diagnosis not present

## 2024-03-29 DIAGNOSIS — Z124 Encounter for screening for malignant neoplasm of cervix: Secondary | ICD-10-CM | POA: Diagnosis not present

## 2024-03-30 ENCOUNTER — Other Ambulatory Visit (HOSPITAL_COMMUNITY): Payer: Self-pay

## 2024-03-31 DIAGNOSIS — F4310 Post-traumatic stress disorder, unspecified: Secondary | ICD-10-CM | POA: Diagnosis not present

## 2024-04-04 ENCOUNTER — Other Ambulatory Visit: Payer: Self-pay

## 2024-04-04 ENCOUNTER — Other Ambulatory Visit (HOSPITAL_COMMUNITY): Payer: Self-pay

## 2024-04-04 ENCOUNTER — Other Ambulatory Visit: Payer: Self-pay | Admitting: Psychiatry

## 2024-04-04 DIAGNOSIS — F900 Attention-deficit hyperactivity disorder, predominantly inattentive type: Secondary | ICD-10-CM

## 2024-04-04 MED ORDER — LISDEXAMFETAMINE DIMESYLATE 50 MG PO CAPS
50.0000 mg | ORAL_CAPSULE | Freq: Every day | ORAL | 0 refills | Status: AC
  Filled 2024-04-04: qty 30, 30d supply, fill #0

## 2024-04-04 MED ORDER — LETROZOLE 2.5 MG PO TABS
2.5000 mg | ORAL_TABLET | Freq: Every day | ORAL | 0 refills | Status: DC
  Filled 2024-04-04: qty 5, 5d supply, fill #0

## 2024-04-05 ENCOUNTER — Other Ambulatory Visit (HOSPITAL_COMMUNITY): Payer: Self-pay

## 2024-04-07 DIAGNOSIS — F4310 Post-traumatic stress disorder, unspecified: Secondary | ICD-10-CM | POA: Diagnosis not present

## 2024-04-14 ENCOUNTER — Telehealth (INDEPENDENT_AMBULATORY_CARE_PROVIDER_SITE_OTHER): Admitting: Psychiatry

## 2024-04-14 ENCOUNTER — Other Ambulatory Visit (HOSPITAL_COMMUNITY): Payer: Self-pay

## 2024-04-14 DIAGNOSIS — F900 Attention-deficit hyperactivity disorder, predominantly inattentive type: Secondary | ICD-10-CM | POA: Diagnosis not present

## 2024-04-14 DIAGNOSIS — F331 Major depressive disorder, recurrent, moderate: Secondary | ICD-10-CM | POA: Diagnosis not present

## 2024-04-14 DIAGNOSIS — F4321 Adjustment disorder with depressed mood: Secondary | ICD-10-CM

## 2024-04-14 DIAGNOSIS — F411 Generalized anxiety disorder: Secondary | ICD-10-CM | POA: Diagnosis not present

## 2024-04-14 NOTE — Progress Notes (Signed)
 [facility] MD/PA/NP OP Progress Note  04/14/2024 9:53 AM Tracy Chang  MRN:  [id number]  Chief Complaint: Routine follow-up  Virtual Visit via Video Note  I connected with Tracy Chang on 04/14/24 at 10:00 AM EDT by a video enabled telemedicine application and verified that I am speaking with the correct person using two identifiers.  Location: Patient: [address]  [place] [id number]  Provider: [facility] Office of Provider   I discussed the limitations of evaluation and management by telemedicine and the availability of in person appointments. The patient expressed understanding and agreed to proceed.    I discussed the assessment and treatment plan with the patient. The patient was provided an opportunity to ask questions and all were answered. The patient agreed with the plan and demonstrated an understanding of the instructions.   The patient was advised to call back or seek an in-person evaluation if the symptoms worsen or if the condition fails to improve as anticipated.  I provided 30 minutes of non-face-to-face time during this encounter.   [name], NP   HPI: 38 year old female presenting ARPA for follow-up.  Patient reports that she is having some considerations with having to take her medication to a pregnancy she is hoping to have.  Patient currently takes Vyvanse  50 mg which she has stopped 2 days ago as she states that she is in the fertile range in which she reports that she also decreased her sertraline  to 100 mg once daily to prepare for possible pregnancy.  Patient reports that she has strong emotions regarding to his pregnancy as her last pregnancy was a miscarriage.  Patient became tearful and concerned while sharing the story.  Patient reports that she has been meeting with her therapist weekly and states that she has been developing good coping strategies and is now able to walk into the room of where her mother has passed.  Patient reports that  she is making a lot of progress.  Patient reports that she is looking for options for vitamins in regards of being able to help manage her ADHD in which, she would like to try to at least get something to help her and helping her focus and concentration while she is working.  Patient has been encouraged to speak with a therapist and develop ADHD skills and strategies to help support her.  Based on this assessment interview is recommended for the patient to stop Vyvanse .  Decrease sertraline  down to 100 mg once daily.  Patient has been encouraged to seek out ADHD strategies and techniques by her therapist.  Patient to continue weekly therapy.  Patient is in agreement with treatment plan.  Patient with no other questions or concerns at this time.  Patient denies SI, HI, AVH.  Patient to follow-up in 3 months. Visit Diagnosis:    ICD-10-CM   1. Moderate episode of recurrent major depressive disorder (HCC)  F33.1     2. Attention deficit hyperactivity disorder (ADHD), predominantly inattentive type  F90.0     3. Grieving  F43.21     4. GAD (generalized anxiety disorder)  F41.1       Past Psychiatric History:  Previous Psych Hospitalizations: Denies   Outpatient treatment: Currently being treated at [facility], and seeing a talk therapist weekly.    Medications Current: - Zoloft  150 mg once a day - Vyvanse  50 mg once a day - Hydroxyzine  10 mg 3 times a day as needed for anxiety.   Medication Trials: -  Adderall, states poor response as a child - Adderall XR, states poor response as a child - Strattera, states poor response as a child   Next Steps: - Patient reports increased irritability and mood with stimulant usage, patient to be considered for mood disorder. -Investigate mood disorder possibility.   Suicide & Violence: Denies SI, HI, AVH.   Psychotherapy: Currently participate and talk therapy once weekly   Legal: Denies  Past Medical History:  Past Medical History:  Diagnosis Date    ADD (attention deficit disorder)    Anxiety    Asthma    Back pain    Complication of anesthesia    Conscious sedation-extreme agitation   Fatty liver    Gallbladder problem    Generalized anxiety disorder    Heartburn    High blood pressure    High cholesterol    Irritable bowel syndrome    Joint pain    Multiple food allergies    Post partum depression    Prediabetes    Pregnancy induced hypertension    SOB (shortness of breath)     Past Surgical History:  Procedure Laterality Date   CESAREAN SECTION N/A 08/06/2020   Procedure: CESAREAN SECTION;  Surgeon: Gorge Ade, MD;  Location: [facility] LD ORS;  Service: Obstetrics;  Laterality: N/A;   CHOLECYSTECTOMY     TONSILLECTOMY      Family Psychiatric History: No additional  Family History:  Family History  Problem Relation Age of Onset   Cancer Mother        lymphoma   Hyperlipidemia Mother    Hypertension Mother    Heart disease Mother    Kidney disease Mother    Depression Mother    Anxiety disorder Mother    Sleep apnea Mother    Eating disorder Mother    Obesity Mother    Cancer Father        lung    Social History:  Social History   Socioeconomic History   Marital status: Married    Spouse name: Briauna Tracy Chang   Number of children: 1   Years of education: Not on file   Highest education level: Associate degree: occupational, Scientist, product/process development, or vocational program  Occupational History   Occupation: Nurse  Tobacco Use   Smoking status: Never   Smokeless tobacco: Never  Vaping Use   Vaping status: Never Used  Substance and Sexual Activity   Alcohol use: Not Currently    Comment: SOCIAL   Drug use: Never   Sexual activity: Yes  Other Topics Concern   Not on file  Social History Narrative   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    Social Drivers of Health   Financial Resource Strain: Low Risk  (12/18/2022)   Received from [facility]   Overall Financial Resource Strain (CARDIA)    Difficulty of  Paying Living Expenses: Not hard at all  Food Insecurity: No Food Insecurity (12/18/2022)   Received from [facility]   Hunger Vital Sign    Within the past 12 months, you worried that your food would run out before you got the money to buy more.: Never true    Within the past 12 months, the food you bought just didn't last and you didn't have money to get more.: Never true  Transportation Needs: No Transportation Needs (12/18/2022)   Received from [facility] - Transportation    Lack of Transportation (Medical): No    Lack of Transportation (Non-Medical): No  Physical  Activity: Not on file  Stress: Not on file  Social Connections: Unknown (12/10/2021)   Received from [facility]   Social Network    Social Network: Not on file    Allergies:  Allergies  Allergen Reactions   Augmentin  [Amoxicillin -Pot Clavulanate] Hives    States that she can take amoxicillin    Ceclor [Cefaclor] Hives   Demerol [Meperidine] Hives   Meloxicam  Swelling    Angioedema   Bee Venom    Latex    Versed [Midazolam]     Panic attack while on versed   Compazine [Prochlorperazine] Anxiety    Metabolic Disorder Labs: Lab Results  Component Value Date   HGBA1C 5.9 08/25/2023   No results found for: PROLACTIN Lab Results  Component Value Date   CHOL 223 (H) 08/25/2023   TRIG 145.0 08/25/2023   HDL 50.10 08/25/2023   CHOLHDL 4 08/25/2023   VLDL 29.0 08/25/2023   LDLCALC 144 (H) 08/25/2023   LDLCALC 99 05/13/2022   Lab Results  Component Value Date   TSH 2.25 08/25/2023   TSH 3.590 12/03/2021    Therapeutic Level Labs: No results found for: LITHIUM No results found for: VALPROATE No results found for: CBMZ  Current Medications: Current Outpatient Medications  Medication Sig Dispense Refill   albuterol (VENTOLIN HFA) 108 (90 Base) MCG/ACT inhaler Inhale 1-2 puffs into the lungs every 6 (six) hours as needed for wheezing or shortness of breath.     ALPRAZolam  (XANAX )  0.25 MG tablet Take 1 tablet (0.25 mg total) by mouth 2 (two) times daily as needed for anxiety. 20 tablet 0   buPROPion  (WELLBUTRIN  SR) 150 MG 12 hr tablet Take 1 tablet (150 mg total) by mouth 2 (two) times daily. 50 tablet 0   cetirizine (ZYRTEC) 10 MG tablet Take 10 mg by mouth daily.     doxycycline  (VIBRA -TABS) 100 MG tablet Take 1 tablet (100 mg total) by mouth 2 (two) times daily. 20 tablet 0   EPINEPHrine  0.3 mg/0.3 mL IJ SOAJ injection Inject 0.3 mg into the muscle as needed for anaphylaxis. 1 each 0   fluconazole  (DIFLUCAN ) 150 MG tablet Take 1 tablet PO once. Repeat in 3 days if needed. 2 tablet 0   fluticasone  (FLONASE ) 50 MCG/ACT nasal spray SPRAY 2 SPRAYS INTO EACH NOSTRIL EVERY DAY 16 mL 0   hydrOXYzine  (ATARAX ) 25 MG tablet Take 1 tablet (25 mg total) by mouth 3 (three) times daily as needed. 90 tablet 2   letrozole  (FEMARA ) 2.5 MG tablet Take by mouth.     letrozole  (FEMARA ) 2.5 MG tablet Take 1 tablet (2.5 mg total) by mouth daily on days 3-7 of cycle as directed. 5 tablet 0   lisdexamfetamine (VYVANSE ) 50 MG capsule Take 1 capsule (50 mg total) by mouth daily. 30 capsule 0   metFORMIN  (GLUCOPHAGE -XR) 500 MG 24 hr tablet Take 1 tablet (500 mg total) by mouth daily with breakfast. 30 tablet 0   montelukast  (SINGULAIR ) 10 MG tablet Take 1 tablet (10 mg total) by mouth at bedtime. 90 tablet 1   Prenatal Vit-Fe Fumarate-FA (PRENATAL MULTIVITAMIN) TABS tablet Take 1 tablet by mouth daily at 12 noon.     sertraline  (ZOLOFT ) 100 MG tablet Take 1.5 tablets (150 mg total) by mouth daily. 90 tablet 0   traZODone (DESYREL) 50 MG tablet Take 50 mg by mouth daily.     valACYclovir (VALTREX) 1000 MG tablet Take 1,000 mg by mouth as needed.     Vitamin D , Ergocalciferol , (DRISDOL ) 1.25  MG (50000 UNIT) CAPS capsule Take 1 capsule (50,000 Units total) by mouth every 7 (seven) days. 8 capsule 0   No current facility-administered medications for this visit.     Musculoskeletal: Strength &  Muscle Tone: within normal limits Gait & Station: normal Patient leans: N/A   Psychiatric Specialty Exam: Review of Systems  Constitutional: Negative.   HENT: Negative.    Eyes: Negative.   Respiratory: Negative.    Cardiovascular: Negative.   Gastrointestinal: Negative.   Endocrine: Negative.   Genitourinary: Negative.   Musculoskeletal: Negative.   Skin: Negative.   Allergic/Immunologic: Negative.   Neurological: Negative.   Hematological: Negative.   Psychiatric/Behavioral:  Positive for dysphoric mood.        Intrusive thoughts     There were no vitals taken for this visit.There is no height or weight on file to calculate BMI.  General Appearance: Well Groomed  Eye Contact:  Good  Speech:  Clear and Coherent  Volume:  Normal  Mood:  Depressed  Affect:  Depressed  Thought Process:  Coherent  Orientation:  Full (Time, Place, and Person)  Thought Content: Logical   Suicidal Thoughts:  No  Homicidal Thoughts:  No  Memory:  Immediate;   Good Recent;   Good Remote;   Good  Judgement:  Good  Insight:  Good  Psychomotor Activity:  Normal  Concentration:  Concentration: Good and Attention Span: Good  Recall:  Good  Fund of Knowledge: Good  Language: Good  Akathisia:  No  Handed:  Right  AIMS (if indicated):   Assets:  Desire for Improvement Financial Resources/Insurance Housing  ADL's:  Intact  Cognition: WNL  Sleep:  Good   Screenings: GAD-7    Flowsheet Row Video Visit from 01/14/2024 in [facility] Video Visit from 12/31/2023 in [facility] Office Visit from 10/16/2023 in [facility] [place] Office Visit from 08/25/2023 in [facility]  Total GAD-7 Score 5 11 9 18    PHQ2-9    Flowsheet Row Video Visit from 01/14/2024 in [facility] Video Visit from 12/31/2023 in [facility] Office Visit from 12/01/2023 in [facility] Office Visit from 10/29/2023 in [facility] Office Visit from 10/16/2023 in [facility] [place]  PHQ-2 Total Score 1 2 4 3 2   PHQ-9 Total Score 6 14 6 11 6    Flowsheet Row ED from 03/02/2024 in [facility] at [facility] Video Visit from 01/14/2024 in [facility] Video Visit from 12/31/2023 in [facility]  C-SSRS RISK CATEGORY No Risk No Risk No Risk     Assessment and Plan:  Assessment - Diagnosis: Grieving [F43.21]  2. Severe episode of recurrent major depressive disorder, without psychotic features  3. GAD (generalized anxiety disorder) [F41.1]  4. Attention deficit hyperactivity disorder (ADHD), predominantly inattentive type [F90.0] , Childhood onset. 5.  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  - Risk Factors: Suicidal risk, stimulant use disorder risk  Plan - Medications:  Continue sertraline  150 mg p.o. once daily prescribed by midwife, for MDD Stop Vyvanse  50mg , due to trying to get pregnant.  Continue Hydroxyzine  to 10mg   TID as needed for anxiety. Pt educated to not taking medication - Psychotherapy: Recommended for weekly talk therapy, patient currently with therapist Vernell Chester and satisfied with therapy. - Education: Patient educated on ADHD symptoms as well as medication management.  Patient also educated on Xanax  long-term use and was advised to taper in which she was in agreement.  Patient educated on medications, purpose, usage, side effects and adverse reactions and notified to reach out to clinic should  she have any worsening symptoms. - Follow-Up: Patient will follow-up in 3 month for medication monitoring - Referrals: No referrals - Safety Planning: Patient safety plan stating that she will call 911 or go to the emergency department should she have suicidal ideation with or without a plan.  Patient also states no firearms within the home.  Patient is in agreement with safety planning.    Patient/Guardian was advised Release of Information must be obtained prior to any record release in order to collaborate their care with an outside provider. Patient/Guardian was advised if they have not already done so to contact the registration department to sign all necessary forms in order for us  to release information regarding their care.   Consent: Patient/Guardian gives verbal consent for treatment and assignment of benefits for services provided during this visit. Patient/Guardian expressed understanding and agreed to proceed.    [name], NP 04/14/2024, 9:53 AM

## 2024-04-15 DIAGNOSIS — F4310 Post-traumatic stress disorder, unspecified: Secondary | ICD-10-CM | POA: Diagnosis not present

## 2024-04-22 DIAGNOSIS — N979 Female infertility, unspecified: Secondary | ICD-10-CM | POA: Diagnosis not present

## 2024-04-26 DIAGNOSIS — F4310 Post-traumatic stress disorder, unspecified: Secondary | ICD-10-CM | POA: Diagnosis not present

## 2024-04-29 ENCOUNTER — Other Ambulatory Visit (HOSPITAL_COMMUNITY): Payer: Self-pay

## 2024-04-29 MED ORDER — LETROZOLE 2.5 MG PO TABS
5.0000 mg | ORAL_TABLET | Freq: Every day | ORAL | 0 refills | Status: DC
  Filled 2024-04-29: qty 10, 5d supply, fill #0

## 2024-04-29 MED ORDER — SERTRALINE HCL 100 MG PO TABS
150.0000 mg | ORAL_TABLET | Freq: Every day | ORAL | 0 refills | Status: DC
  Filled 2024-04-29: qty 135, 90d supply, fill #0

## 2024-05-05 DIAGNOSIS — F4310 Post-traumatic stress disorder, unspecified: Secondary | ICD-10-CM | POA: Diagnosis not present

## 2024-05-10 DIAGNOSIS — Z3169 Encounter for other general counseling and advice on procreation: Secondary | ICD-10-CM | POA: Diagnosis not present

## 2024-05-11 ENCOUNTER — Other Ambulatory Visit: Payer: Self-pay

## 2024-05-11 ENCOUNTER — Other Ambulatory Visit (HOSPITAL_COMMUNITY): Payer: Self-pay

## 2024-05-11 DIAGNOSIS — Z31430 Encounter of female for testing for genetic disease carrier status for procreative management: Secondary | ICD-10-CM | POA: Diagnosis not present

## 2024-05-11 MED ORDER — CHORIONIC GONADOTROPIN 10000 UNITS IM SOLR
6500.0000 [IU] | Freq: Once | INTRAMUSCULAR | 0 refills | Status: AC
  Filled 2024-05-11: qty 10, 1d supply, fill #0

## 2024-05-11 MED ORDER — LETROZOLE 2.5 MG PO TABS
ORAL_TABLET | ORAL | 3 refills | Status: AC
  Filled 2024-05-11: qty 15, 28d supply, fill #0
  Filled 2024-05-27: qty 15, 5d supply, fill #0
  Filled 2024-06-25: qty 15, 5d supply, fill #1
  Filled 2024-09-15: qty 15, 5d supply, fill #2

## 2024-05-12 ENCOUNTER — Other Ambulatory Visit (HOSPITAL_COMMUNITY): Payer: Self-pay

## 2024-05-12 DIAGNOSIS — N96 Recurrent pregnancy loss: Secondary | ICD-10-CM | POA: Diagnosis not present

## 2024-05-12 DIAGNOSIS — Z319 Encounter for procreative management, unspecified: Secondary | ICD-10-CM | POA: Diagnosis not present

## 2024-05-16 DIAGNOSIS — F4310 Post-traumatic stress disorder, unspecified: Secondary | ICD-10-CM | POA: Diagnosis not present

## 2024-05-26 ENCOUNTER — Other Ambulatory Visit (HOSPITAL_COMMUNITY): Payer: Self-pay

## 2024-05-26 DIAGNOSIS — F4310 Post-traumatic stress disorder, unspecified: Secondary | ICD-10-CM | POA: Diagnosis not present

## 2024-05-27 ENCOUNTER — Other Ambulatory Visit (HOSPITAL_COMMUNITY): Payer: Self-pay

## 2024-05-31 DIAGNOSIS — F4310 Post-traumatic stress disorder, unspecified: Secondary | ICD-10-CM | POA: Diagnosis not present

## 2024-06-03 ENCOUNTER — Other Ambulatory Visit (HOSPITAL_COMMUNITY): Payer: Self-pay

## 2024-06-03 MED ORDER — CHORIONIC GONADOTROPIN 10000 UNITS IM SOLR
6500.0000 [IU] | INTRAMUSCULAR | 3 refills | Status: AC
  Filled 2024-06-03: qty 1, 1d supply, fill #0

## 2024-06-04 ENCOUNTER — Telehealth: Admitting: Physician Assistant

## 2024-06-04 DIAGNOSIS — B9789 Other viral agents as the cause of diseases classified elsewhere: Secondary | ICD-10-CM | POA: Diagnosis not present

## 2024-06-04 DIAGNOSIS — J218 Acute bronchiolitis due to other specified organisms: Secondary | ICD-10-CM

## 2024-06-04 MED ORDER — PREDNISONE 20 MG PO TABS: 20.0000 mg | ORAL_TABLET | Freq: Every day | ORAL | 0 refills | Status: AC

## 2024-06-04 MED ORDER — PSEUDOEPH-BROMPHEN-DM 30-2-10 MG/5ML PO SYRP: 5.0000 mL | ORAL_SOLUTION | Freq: Four times a day (QID) | ORAL | 0 refills | Status: AC | PRN

## 2024-06-04 NOTE — Patient Instructions (Signed)
 Leita Vaughn Daring, thank you for joining Teena Shuck, PA-C for today's virtual visit.  While this provider is not your primary care provider (PCP), if your PCP is located in our provider database this encounter information will be shared with them immediately following your visit.   A [facility] MyChart account gives you access to today's visit and all your visits, tests, and labs performed at [facility]  click here if you don't have a  MyChart account or go to [email or web address]  Consent: (Patient) Tracy Chang provided verbal consent for this virtual visit at the beginning of the encounter.  Current Medications:  Current Outpatient Medications:    albuterol (VENTOLIN HFA) 108 (90 Base) MCG/ACT inhaler, Inhale 1-2 puffs into the lungs every 6 (six) hours as needed for wheezing or shortness of breath., Disp: , Rfl:    ALPRAZolam  (XANAX ) 0.25 MG tablet, Take 1 tablet (0.25 mg total) by mouth 2 (two) times daily as needed for anxiety., Disp: 20 tablet, Rfl: 0   buPROPion  (WELLBUTRIN  SR) 150 MG 12 hr tablet, Take 1 tablet (150 mg total) by mouth 2 (two) times daily., Disp: 50 tablet, Rfl: 0   cetirizine (ZYRTEC) 10 MG tablet, Take 10 mg by mouth daily., Disp: , Rfl:    doxycycline  (VIBRA -TABS) 100 MG tablet, Take 1 tablet (100 mg total) by mouth 2 (two) times daily., Disp: 20 tablet, Rfl: 0   EPINEPHrine  0.3 mg/0.3 mL IJ SOAJ injection, Inject 0.3 mg into the muscle as needed for anaphylaxis., Disp: 1 each, Rfl: 0   fluconazole  (DIFLUCAN ) 150 MG tablet, Take 1 tablet PO once. Repeat in 3 days if needed., Disp: 2 tablet, Rfl: 0   fluticasone  (FLONASE ) 50 MCG/ACT nasal spray, SPRAY 2 SPRAYS INTO EACH NOSTRIL EVERY DAY, Disp: 16 mL, Rfl: 0   human chorionic gonadotropin  (PREGNYL) 10000 units injection, Inject 6.5 mLs (6,500 Units total) as directed as directed., Disp: 1 each, Rfl: 3   hydrOXYzine  (ATARAX ) 25 MG tablet, Take 1 tablet (25 mg total) by mouth 3  (three) times daily as needed., Disp: 90 tablet, Rfl: 2   letrozole  (FEMARA ) 2.5 MG tablet, Take by mouth., Disp: , Rfl:    letrozole  (FEMARA ) 2.5 MG tablet, Take 3 tablets by mouth at bedtime on cycle days 3-7, Disp: 15 tablet, Rfl: 3   lisdexamfetamine (VYVANSE ) 50 MG capsule, Take 1 capsule (50 mg total) by mouth daily., Disp: 30 capsule, Rfl: 0   metFORMIN  (GLUCOPHAGE -XR) 500 MG 24 hr tablet, Take 1 tablet (500 mg total) by mouth daily with breakfast., Disp: 30 tablet, Rfl: 0   montelukast  (SINGULAIR ) 10 MG tablet, Take 1 tablet (10 mg total) by mouth at bedtime., Disp: 90 tablet, Rfl: 1   Prenatal Vit-Fe Fumarate-FA (PRENATAL MULTIVITAMIN) TABS tablet, Take 1 tablet by mouth daily at 12 noon., Disp: , Rfl:    sertraline  (ZOLOFT ) 100 MG tablet, Take 1.5 tablets (150 mg total) by mouth daily., Disp: 135 tablet, Rfl: 0   traZODone (DESYREL) 50 MG tablet, Take 50 mg by mouth daily., Disp: , Rfl:    valACYclovir (VALTREX) 1000 MG tablet, Take 1,000 mg by mouth as needed., Disp: , Rfl:    Vitamin D , Ergocalciferol , (DRISDOL ) 1.25 MG (50000 UNIT) CAPS capsule, Take 1 capsule (50,000 Units total) by mouth every 7 (seven) days., Disp: 8 capsule, Rfl: 0   Medications ordered in this encounter:  No orders of the defined types were placed in this encounter.    *If you need refills on other  medications prior to your next appointment, please contact your pharmacy*  Follow-Up: Call back or seek an in-person evaluation if the symptoms worsen or if the condition fails to improve as anticipated.  [facility] [phone]  Other Instructions Please report to the nearest Emergency room with any worsening symptoms. Follow up with primary care provider (PCP) in 2 -3 days.    If you have been instructed to have an in-person evaluation today at a local Urgent Care facility, please use the link below. It will take you to a list of all of our available [facility], including  address, phone number and hours of operation. Please do not delay care.  [facility]  If you or a family member do not have a primary care provider, use the link below to schedule a visit and establish care. When you choose a [facility] primary care physician or advanced practice provider, you gain a long-term partner in health. Find a Primary Care Provider  Learn more about [place] [facility]'s in-office and virtual care options: [place] - Get Care Now

## 2024-06-04 NOTE — Progress Notes (Signed)
 Virtual Visit Consent   [name], you are scheduled for a virtual visit with a [facility] provider today. Just as with appointments in the office, your consent must be obtained to participate. Your consent will be active for this visit and any virtual visit you may have with one of our providers in the next 365 days. If you have a MyChart account, a copy of this consent can be sent to you electronically.  As this is a virtual visit, video technology does not allow for your provider to perform a traditional examination. This may limit your provider's ability to fully assess your condition. If your provider identifies any concerns that need to be evaluated in person or the need to arrange testing (such as labs, EKG, etc.), we will make arrangements to do so. Although advances in technology are sophisticated, we cannot ensure that it will always work on either your end or our end. If the connection with a video visit is poor, the visit may have to be switched to a telephone visit. With either a video or telephone visit, we are not always able to ensure that we have a secure connection.  By engaging in this virtual visit, you consent to the provision of healthcare and authorize for your insurance to be billed (if applicable) for the services provided during this visit. Depending on your insurance coverage, you may receive a charge related to this service.  I need to obtain your verbal consent now. Are you willing to proceed with your visit today? Tracy Chang has provided verbal consent on 06/04/2024 for a virtual visit (video or telephone). Teena Shuck, [place]  Date: 06/04/2024 11:08 AM   Virtual Visit via Video Note   I, Teena Shuck, connected with  Tracy Chang  ([id number], 04/17/1986) on 06/04/24 at 10:00 AM EDT by a video-enabled telemedicine application and verified that I am speaking with the correct person using two identifiers.  Location: Patient: Virtual Visit  Location Patient: Home Provider: Virtual Visit Location Provider: Home Office   I discussed the limitations of evaluation and management by telemedicine and the availability of in person appointments. The patient expressed understanding and agreed to proceed.    History of Present Illness: Tracy Chang is a 38 y.o. who identifies as a female who was assigned female at birth, and is being seen today for bronchitis.  HPI: Cough This is a recurrent problem. The current episode started in the past 7 days. The problem has been gradually worsening. The problem occurs constantly. Associated symptoms include ear congestion and ear pain. Nothing aggravates the symptoms. She has tried a beta-agonist inhaler for the symptoms. The treatment provided no relief. Her past medical history is significant for asthma and bronchitis.    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   GAD (generalized anxiety disorder) 08/25/2023   Prediabetes 08/25/2023   Vitamin D  deficiency 08/25/2023   Anemia associated with acute blood loss EBL 724 08/07/2020   Anxiety 08/06/2020   Asthma 08/06/2020   Failure to progress in labor 08/06/2020   Status post primary low transverse cesarean section 12/27 08/06/2020   Postpartum care following cesarean delivery 12/27 08/06/2020   Morbid obesity (HCC) 08/06/2020   Gestational hypertension 08/05/2020  Allergies:  Allergies  Allergen Reactions   Augmentin  [Amoxicillin -Pot Clavulanate] Hives    States that she can take amoxicillin    Ceclor [Cefaclor] Hives   Demerol [Meperidine] Hives   Meloxicam  Swelling    Angioedema   Bee Venom    Latex    Versed [Midazolam]     Panic attack while on versed    Compazine [Prochlorperazine] Anxiety   Medications:  Current Outpatient Medications:    albuterol (VENTOLIN HFA) 108 (90 Base) MCG/ACT inhaler, Inhale 1-2 puffs into the lungs every 6 (six) hours as needed for wheezing or shortness of breath., Disp: , Rfl:    ALPRAZolam  (XANAX ) 0.25 MG tablet, Take 1 tablet (0.25 mg total) by mouth 2 (two) times daily as needed for anxiety., Disp: 20 tablet, Rfl: 0   buPROPion  (WELLBUTRIN  SR) 150 MG 12 hr tablet, Take 1 tablet (150 mg total) by mouth 2 (two) times daily., Disp: 50 tablet, Rfl: 0   cetirizine (ZYRTEC) 10 MG tablet, Take 10 mg by mouth daily., Disp: , Rfl:    doxycycline  (VIBRA -TABS) 100 MG tablet, Take 1 tablet (100 mg total) by mouth 2 (two) times daily., Disp: 20 tablet, Rfl: 0   EPINEPHrine  0.3 mg/0.3 mL IJ SOAJ injection, Inject 0.3 mg into the muscle as needed for anaphylaxis., Disp: 1 each, Rfl: 0   fluconazole  (DIFLUCAN ) 150 MG tablet, Take 1 tablet PO once. Repeat in 3 days if needed., Disp: 2 tablet, Rfl: 0   fluticasone  (FLONASE ) 50 MCG/ACT nasal spray, SPRAY 2 SPRAYS INTO EACH NOSTRIL EVERY DAY, Disp: 16 mL, Rfl: 0   human chorionic gonadotropin  (PREGNYL) 10000 units injection, Inject 6.5 mLs (6,500 Units total) as directed as directed., Disp: 1 each, Rfl: 3   hydrOXYzine  (ATARAX ) 25 MG tablet, Take 1 tablet (25 mg total) by mouth 3 (three) times daily as needed., Disp: 90 tablet, Rfl: 2   letrozole  (FEMARA ) 2.5 MG tablet, Take by mouth., Disp: , Rfl:    letrozole  (FEMARA ) 2.5 MG tablet, Take 3 tablets by mouth at bedtime on cycle days 3-7, Disp: 15 tablet, Rfl: 3   lisdexamfetamine (VYVANSE ) 50 MG capsule, Take 1 capsule (50 mg total) by mouth daily., Disp: 30 capsule, Rfl: 0   metFORMIN  (GLUCOPHAGE -XR) 500 MG 24 hr tablet, Take 1 tablet (500 mg total) by mouth daily with breakfast., Disp: 30 tablet, Rfl: 0   montelukast  (SINGULAIR ) 10 MG tablet, Take 1 tablet (10 mg total) by mouth at bedtime., Disp: 90 tablet, Rfl: 1   Prenatal Vit-Fe  Fumarate-FA (PRENATAL MULTIVITAMIN) TABS tablet, Take 1 tablet by mouth daily at 12 noon., Disp: , Rfl:    sertraline  (ZOLOFT ) 100 MG tablet, Take 1.5 tablets (150 mg total) by mouth daily., Disp: 135 tablet, Rfl: 0   traZODone (DESYREL) 50 MG tablet, Take 50 mg by mouth daily., Disp: , Rfl:    valACYclovir (VALTREX) 1000 MG tablet, Take 1,000 mg by mouth as needed., Disp: , Rfl:    Vitamin D , Ergocalciferol , (DRISDOL ) 1.25 MG (50000 UNIT) CAPS capsule, Take 1 capsule (50,000 Units total) by mouth every 7 (seven) days., Disp: 8 capsule, Rfl: 0  Observations/Objective: Patient is well-developed, well-nourished in no acute distress.  Resting comfortably  at home.  Head is normocephalic, atraumatic.  No labored breathing.  Speech is clear and coherent with logical content.  Patient is alert and oriented at baseline.    Assessment and Plan: 1. Acute bacterial bronchitis (Primary)  Initial considerations in this patient included influenza, bacterial and viral etiologies of  upper respiratory infection (URI), pneumonia, sinusitis, toxic exposure, sepsis, meningitis, encephalitis, and other pulmonary or cardiac etiologies among others.     Bronchitis,  felt to be likely cause of  symptoms as patient notes similar previous episodes.  Prior to discharge, we discussed ER  precautions, specifically for symptoms suggestive of bacterial co-infection or worsening illness, and recommended follow up with primary care provider within 2-3 days or ER and the patient demonstrated understanding and agreement with this plan.  Follow Up Instructions: I discussed the assessment and treatment plan with the patient. The patient was provided an opportunity to ask questions and all were answered. The patient agreed with the plan and demonstrated an understanding of the instructions.  A copy of instructions were sent to the patient via MyChart unless otherwise noted below.    The patient was advised to call back or  seek an in-person evaluation if the symptoms worsen or if the condition fails to improve as anticipated.    Teena Shuck, PA-C

## 2024-06-06 ENCOUNTER — Telehealth: Admitting: Psychiatry

## 2024-06-06 DIAGNOSIS — Z3169 Encounter for other general counseling and advice on procreation: Secondary | ICD-10-CM | POA: Diagnosis not present

## 2024-06-09 ENCOUNTER — Other Ambulatory Visit (HOSPITAL_COMMUNITY): Payer: Self-pay

## 2024-06-10 ENCOUNTER — Telehealth: Admitting: Psychiatry

## 2024-06-14 ENCOUNTER — Other Ambulatory Visit (HOSPITAL_COMMUNITY): Payer: Self-pay

## 2024-06-23 ENCOUNTER — Ambulatory Visit (INDEPENDENT_AMBULATORY_CARE_PROVIDER_SITE_OTHER): Admitting: General Practice

## 2024-06-23 ENCOUNTER — Encounter: Payer: Self-pay | Admitting: General Practice

## 2024-06-23 ENCOUNTER — Ambulatory Visit: Payer: Self-pay

## 2024-06-23 VITALS — BP 124/82 | HR 95 | Temp 98.0°F | Ht 62.0 in | Wt 305.0 lb

## 2024-06-23 DIAGNOSIS — J208 Acute bronchitis due to other specified organisms: Secondary | ICD-10-CM | POA: Diagnosis not present

## 2024-06-23 DIAGNOSIS — H938X3 Other specified disorders of ear, bilateral: Secondary | ICD-10-CM | POA: Diagnosis not present

## 2024-06-23 DIAGNOSIS — B9689 Other specified bacterial agents as the cause of diseases classified elsewhere: Secondary | ICD-10-CM | POA: Diagnosis not present

## 2024-06-23 MED ORDER — PREDNISONE 20 MG PO TABS: 40.0000 mg | ORAL_TABLET | Freq: Every day | ORAL | 0 refills | Status: AC

## 2024-06-23 MED ORDER — DOXYCYCLINE HYCLATE 100 MG PO TABS: 100.0000 mg | ORAL_TABLET | Freq: Two times a day (BID) | ORAL | 0 refills | Status: AC

## 2024-06-23 MED ORDER — ALBUTEROL SULFATE HFA 108 (90 BASE) MCG/ACT IN AERS
1.0000 | INHALATION_SPRAY | Freq: Four times a day (QID) | RESPIRATORY_TRACT | 1 refills | Status: AC | PRN
Start: 2024-06-23 — End: ?

## 2024-06-23 MED ORDER — FLUTICASONE PROPIONATE 50 MCG/ACT NA SUSP: 2.0000 | Freq: Every day | NASAL | 0 refills | Status: AC

## 2024-06-23 NOTE — Telephone Encounter (Signed)
 FYI Only or Action Required?: FYI only for provider: appointment scheduled on 06/23/24.  Patient was last seen in primary care on 06/04/2024 by Rolan Berthold, PA-C.  Called Nurse Triage reporting Cough.  Symptoms began several weeks ago.  Interventions attempted: Prescription medications: prednisone , inhaler.  Symptoms are: gradually worsening.  Triage Disposition: See HCP Within 4 Hours (Or PCP Triage)  Patient/caregiver understands and will follow disposition?: Yes Reason for Disposition  Wheezing is present  Answer Assessment - Initial Assessment Questions No improvement since televisit. Was tx with predisone with no effect.  1. ONSET: When did the cough begin?      2 weeks 2. SEVERITY: How bad is the cough today?      Severe 3. SPUTUM: Describe the color of your sputum (e.g., none, dry cough; clear, white, yellow, green)     Thick and green 4. DIFFICULTY BREATHING: Are you having difficulty breathing? If Yes, ask: How bad is it? (e.g., mild, moderate, severe)      SOB, improves with inhaler and nebulizer 5. LUNG HISTORY: Do you have any history of lung disease?  (e.g., pulmonary embolus, asthma, emphysema)     Asthma, prone to bronchitis  Protocols used: Cough - Acute Productive-A-AH Copied from CRM [id number]. Topic: Clinical - Red Word Triage >> Jun 23, 2024  8:15 AM Donna BRAVO wrote: Red Word that prompted transfer to Nurse Triage: patient calling Symptoms: -severe cough -green thick mucus -congestion -Wheezing  -patient has asthma -a little short of breath

## 2024-06-23 NOTE — Telephone Encounter (Signed)
 noted

## 2024-06-23 NOTE — Progress Notes (Signed)
 Established Patient Office Visit  Subjective   Patient ID: Tracy Chang, female    DOB: [birth year]  Age: 38 y.o. MRN: [id number]  Chief Complaint  Patient presents with   Cough    That is productive, ear fullness and congestion x 2-3 weeks. Did virtual appt 06/04/24 and was given prednisone  x 5 days and that did not help at all. Patient has also been taking mucinex .     HPI  Tracy Chang is a 38 year old female, patient of Adina Crandall, NP, presents today for an acute visit.   Discussed the use of AI scribe software for clinical note transcription with the patient, who gave verbal consent to proceed.  History of Present Illness Tracy Chang is a 38 year old female with asthma who presents with a persistent cough and respiratory symptoms.  She has been experiencing a productive cough for the past two to three weeks, with green sputum. She initially had a virtual appointment on October 25, during which she was prescribed prednisone  20 mg for five days and has been using Mucinex , but these treatments have not alleviated her symptoms. The cough improved slightly with prednisone  but has since returned to its original state.  She also mentions experiencing epistaxis due to frequent nose blowing. She uses cetirizine nightly for allergies and has Flonase  at home. She recently returned from a trip to [place], where she was also unwell, and notes the change in climate from hot and humid to cold may have impacted her condition.  Additional symptoms include nasal congestion, bilateral ear fullness, sinus pain, sore throat, and chest congestion. No chest pain is present, but she experiences shortness of breath on exertion and wheezing. She has been using her son's nebulizer intermittently to manage these symptoms. She has a history of asthma and typically requires antibiotics during similar episodes.  No fevers, chills, or significant fatigue. She mentions taking fertility medications.  No gastrointestinal or urinary symptoms, dizziness, or headaches are reported. She has been using albuterol more frequently than usual, about once or twice a day, due to her current illness.  She has a history of an Augmentin  allergy from childhood, presenting as a rash, but she tolerates amoxicillin  well. She also mentions experiencing epistaxis due to frequent nose blowing.     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/25/2023   GAD (generalized anxiety disorder) 08/25/2023   Prediabetes 08/25/2023   Vitamin D  deficiency 08/25/2023   Anemia associated with acute blood loss EBL 724 08/07/2020   Anxiety 08/06/2020   Asthma 08/06/2020   Failure to progress in labor 08/06/2020   Status post primary low transverse cesarean section 12/27 08/06/2020   Postpartum care following cesarean delivery 12/27 08/06/2020   Morbid obesity (HCC) 08/06/2020   Gestational hypertension 08/05/2020   Past Medical History:  Diagnosis Date   ADD (attention deficit disorder)    Anxiety    Asthma    Back pain    Complication of anesthesia    Conscious sedation-extreme agitation   Fatty liver    Gallbladder problem    Generalized anxiety disorder    Heartburn    High blood pressure    High cholesterol    Irritable bowel syndrome    Joint pain  Multiple food allergies    Post partum depression    Prediabetes    Pregnancy induced hypertension    SOB (shortness of breath)    Past Surgical History:  Procedure Laterality Date   CESAREAN SECTION N/A 08/06/2020   Procedure: CESAREAN SECTION;  Surgeon: Gorge Ade, MD;  Location: [facility] LD ORS;  Service: Obstetrics;  Laterality: N/A;   CHOLECYSTECTOMY     TONSILLECTOMY     Allergies  Allergen Reactions    Augmentin  [Amoxicillin -Pot Clavulanate] Hives    States that she can take amoxicillin    Ceclor [Cefaclor] Hives   Demerol [Meperidine] Hives   Meloxicam  Swelling    Angioedema   Bee Venom    Latex    Versed [Midazolam]     Panic attack while on versed   Compazine [Prochlorperazine] Anxiety         01/14/2024    9:40 AM 12/31/2023    9:26 AM 12/01/2023   11:52 AM  Depression screen PHQ 2/9  Decreased Interest     Down, Depressed, Hopeless     PHQ - 2 Score     Altered sleeping     Tired, decreased energy     Change in appetite     Feeling bad or failure about yourself      Trouble concentrating     Moving slowly or fidgety/restless     Suicidal thoughts     PHQ-9 Score     Difficult doing work/chores        Information is confidential and restricted. Go to Review Flowsheets to unlock data.       01/14/2024    9:42 AM 12/31/2023    9:26 AM 10/16/2023   11:06 AM 08/25/2023   10:39 AM  GAD 7 : Generalized Anxiety Score  Nervous, Anxious, on Edge   1 3  Control/stop worrying   2 3  Worry too much - different things   2 3  Trouble relaxing   1 3  Restless   0 0  Easily annoyed or irritable   2 3  Afraid - awful might happen   1 3  Total GAD 7 Score   9 18  Anxiety Difficulty   Somewhat difficult Extremely difficult     Information is confidential and restricted. Go to Review Flowsheets to unlock data.      Review of Systems  Constitutional:  Positive for malaise/fatigue. Negative for chills and fever.  HENT:  Positive for congestion and sinus pain. Negative for ear pain and sore throat.        Ear fullness  Respiratory:  Positive for cough, sputum production, shortness of breath and wheezing.        Chest congestion   Cardiovascular:  Negative for chest pain.  Gastrointestinal:  Negative for abdominal pain, constipation, diarrhea, heartburn, nausea and vomiting.  Genitourinary:  Negative for dysuria, frequency and urgency.  Neurological:  Negative for dizziness and  headaches.  Endo/Heme/Allergies:  Negative for polydipsia.  Psychiatric/Behavioral:  Negative for depression and suicidal ideas. The patient is not nervous/anxious.       Objective:     BP 124/82   Pulse 95   Temp 98 F (36.7 C) (Oral)   Ht 5' 2 (1.575 m)   Wt (!) 305 lb (138.3 kg)   SpO2 98%   BMI 55.79 kg/m  BP Readings from Last 3 Encounters:  06/23/24 124/82  03/02/24 (!) 160/88  10/16/23 110/68   Wt Readings from Last 3 Encounters:  06/23/24 ROLLEN)  305 lb (138.3 kg)  10/16/23 (!) 312 lb 12.8 oz (141.9 kg)  09/08/23 (!) 306 lb 12.8 oz (139.2 kg)      Physical Exam Vitals and nursing note reviewed.  Constitutional:      Appearance: Normal appearance.  Cardiovascular:     Rate and Rhythm: Normal rate and regular rhythm.     Pulses: Normal pulses.     Heart sounds: Normal heart sounds.  Pulmonary:     Effort: Pulmonary effort is normal.     Breath sounds: Normal breath sounds.  Neurological:     Mental Status: She is alert and oriented to person, place, and time.  Psychiatric:        Mood and Affect: Mood normal.        Behavior: Behavior normal.        Thought Content: Thought content normal.        Judgment: Judgment normal.      No results found for any visits on 06/23/24.     The ASCVD Risk score (Arnett DK, et al., 2019) failed to calculate for the following reasons:   The 2019 ASCVD risk score is only valid for ages 68 to 32    Assessment & Plan:  Acute bacterial bronchitis -     predniSONE ; Take 2 tablets (40 mg total) by mouth daily for 5 days.  Dispense: 10 tablet; Refill: 0 -     Doxycycline  Hyclate; Take 1 tablet (100 mg total) by mouth 2 (two) times daily for 7 days.  Dispense: 14 tablet; Refill: 0 -     Albuterol Sulfate HFA; Inhale 1-2 puffs into the lungs every 6 (six) hours as needed for wheezing or shortness of breath.  Dispense: 6.7 g; Refill: 1  Sensation of fullness in both ears -     Fluticasone  Propionate; Place 2 sprays into  both nostrils daily.  Dispense: 16 mL; Refill: 0    Assessment and Plan Assessment & Plan Acute bronchitis with productive cough and asthma exacerbation Persistent productive cough with green sputum, chest congestion, and exertional dyspnea. Asthma exacerbation with wheezing. Discussed doxycycline -prenatal vitamin interaction. - Prescribed prednisone  40 mg for 5 days. - Prescribed doxycycline  with probiotic. - Advised taking doxycycline  with food. - Refilled albuterol inhaler. - Advised using cool mist humidifier at night. - Instructed to report persistent symptoms for potential chest x-ray.  Bilateral ear fullness with middle ear effusion Bilateral ear fullness with fluid, likely contributing to sinusitis symptoms. - Advised using Flonase  nasal spray to alleviate ear fullness.   Return if symptoms worsen or fail to improve.    Carrol Aurora, NP

## 2024-06-23 NOTE — Patient Instructions (Addendum)
 Start Doxycycline  antibiotic for the infection. Take 1 tablet by mouth twice daily for 7 days.   Start prednisone  20 mg tablets. Take 2 tablets by mouth once daily in the morning for 5 days.  Nasal Congestion/Ear Pressure/Sinus Pressure: Try using Flonase  (fluticasone ) nasal spray. Instill 1 spray in each nostril twice daily.   Follow up if symptoms worsen or do not improve.   Rest, increase fluids and use a humidifier.   It was a pleasure to see you today!

## 2024-06-25 ENCOUNTER — Other Ambulatory Visit (HOSPITAL_COMMUNITY): Payer: Self-pay

## 2024-07-04 DIAGNOSIS — Z3169 Encounter for other general counseling and advice on procreation: Secondary | ICD-10-CM | POA: Diagnosis not present

## 2024-07-11 DIAGNOSIS — Z319 Encounter for procreative management, unspecified: Secondary | ICD-10-CM | POA: Diagnosis not present

## 2024-07-14 DIAGNOSIS — F4310 Post-traumatic stress disorder, unspecified: Secondary | ICD-10-CM | POA: Diagnosis not present

## 2024-07-21 DIAGNOSIS — F4310 Post-traumatic stress disorder, unspecified: Secondary | ICD-10-CM | POA: Diagnosis not present

## 2024-07-21 DIAGNOSIS — N970 Female infertility associated with anovulation: Secondary | ICD-10-CM | POA: Diagnosis not present

## 2024-07-21 DIAGNOSIS — Z319 Encounter for procreative management, unspecified: Secondary | ICD-10-CM | POA: Diagnosis not present

## 2024-07-25 NOTE — Progress Notes (Deleted)
 Tracy Chang Tracy Chang [facility] [address] [place] [address] Phone: [phone]   Assessment and Plan:     ***    Pertinent previous records reviewed include ***   Follow Up: ***     Subjective:   I, Tracy Chang, am serving as a neurosurgeon for Tracy Chang   Chief Complaint: low back pain    HPI:    06/25/2023 Patient is 38 year old female with concerns of low back pain . Patient states that she has pain with walking, if she sits the pain will ease. Pain started a couple of months ago. Pain does not radiate . The pain is a jolting shooting pain. Tylenol  and ibu don't work. She is trying to get pregnant    07/26/2024 Patient states   Relevant Historical Information: None pertinent  Additional pertinent review of systems negative.  Current Medications[1]   Objective:     There were no vitals filed for this visit.    There is no height or weight on file to calculate BMI.    Physical Exam:    ***   Electronically signed by:  Tracy Chang Tracy Chang Tracy [facility] 1:01 PM 07/25/2024    [1]  Current Outpatient Medications:    albuterol  (VENTOLIN  HFA) 108 (90 Base) MCG/ACT inhaler, Inhale 1-2 puffs into the lungs every 6 (six) hours as needed for wheezing or shortness of breath., Disp: 6.7 g, Rfl: 1   ALPRAZolam  (XANAX ) 0.25 MG tablet, Take 1 tablet (0.25 mg total) by mouth 2 (two) times daily as needed for anxiety., Disp: 20 tablet, Rfl: 0   brompheniramine-pseudoephedrine-DM 30-2-10 MG/5ML syrup, Take 5 mLs by mouth 4 (four) times daily as needed., Disp: 120 mL, Rfl: 0   cetirizine (ZYRTEC) 10 MG tablet, Take 10 mg by mouth daily., Disp: , Rfl:    EPINEPHrine  0.3 mg/0.3 mL IJ SOAJ injection, Inject 0.3 mg into the muscle as needed for anaphylaxis., Disp: 1 each, Rfl: 0   fluconazole  (DIFLUCAN ) 150 MG tablet, Take 1 tablet PO once. Repeat in 3 days if needed., Disp: 2 tablet, Rfl: 0   fluticasone   (FLONASE ) 50 MCG/ACT nasal spray, Place 2 sprays into both nostrils daily., Disp: 16 mL, Rfl: 0   human chorionic gonadotropin  (PREGNYL ) 10000 units injection, Inject 6.5 mLs (6,500 Units total) as directed as directed., Disp: 1 each, Rfl: 3   hydrOXYzine  (ATARAX ) 25 MG tablet, Take 1 tablet (25 mg total) by mouth 3 (three) times daily as needed., Disp: 90 tablet, Rfl: 2   letrozole  (FEMARA ) 2.5 MG tablet, Take 3 tablets by mouth at bedtime on cycle days 3-7, Disp: 15 tablet, Rfl: 3   lisdexamfetamine  (VYVANSE ) 50 MG capsule, Take 1 capsule (50 mg total) by mouth daily., Disp: 30 capsule, Rfl: 0   metFORMIN  (GLUCOPHAGE -XR) 500 MG 24 hr tablet, Take 1 tablet (500 mg total) by mouth daily with breakfast., Disp: 30 tablet, Rfl: 0   montelukast  (SINGULAIR ) 10 MG tablet, Take 1 tablet (10 mg total) by mouth at bedtime., Disp: 90 tablet, Rfl: 1   Prenatal Vit-Fe Fumarate-FA (PRENATAL MULTIVITAMIN) TABS tablet, Take 1 tablet by mouth daily at 12 noon., Disp: , Rfl:    sertraline  (ZOLOFT ) 100 MG tablet, Take 1.5 tablets (150 mg total) by mouth daily., Disp: 135 tablet, Rfl: 0   valACYclovir (VALTREX) 1000 MG tablet, Take 1,000 mg by mouth as needed., Disp: , Rfl:    Vitamin D , Ergocalciferol , (DRISDOL ) 1.25 MG (50000 UNIT)  CAPS capsule, Take 1 capsule (50,000 Units total) by mouth every 7 (seven) days., Disp: 8 capsule, Rfl: 0

## 2024-07-26 ENCOUNTER — Ambulatory Visit: Admitting: Sports Medicine

## 2024-07-27 NOTE — Progress Notes (Unsigned)
 [facility] [address] [place] [address] Phone: [phone]   Assessment and Plan:     1. Chronic bilateral low back pain with right-sided sciatica (Primary) 2. 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  Pain has been present for >1-year and worsening over the past several months - Recommend lumbar MRI due to failure to improve despite >6 weeks of conservative therapy that has included physician directed home exercise program, NSAID course, x-ray images on file, pain >6/10, pain with day-to-day activities - Continue HEP for low back - Patient elected for IM injection of methylprednisone 80 mg/Toradol  60 mg.  Injection given in clinic today and tolerated well. - Do not recommend p.o. NSAIDs or prednisone  due to current IVF treatment    Pertinent previous records reviewed include none   Follow Up: 1 week after MRI to review results and discuss treatment plan   Subjective:   I, Tracy Chang, am serving as a neurosurgeon for [name]   Chief Complaint: low back pain    HPI:    06/25/2023 Patient is 38 year old female with concerns of low back pain . Patient states that she has pain with walking, if she sits the pain will ease. Pain started a couple of months ago. Pain does not radiate . The pain is a jolting shooting pain. Tylenol  and ibu don't work. She is trying to get pregnant    07/28/2024 Patient states low back pain that radiates down the leg. Pain is increased when she is up and walking. She states she gets a burning sensation the longer she stands and walks. Does endorse some numbness and tingling. Tylenol  and ibu take the edge off. Pain is relieved when she lays down but comes right back when she stands up   Relevant Historical Information: None pertinent  Additional pertinent  review of systems negative.  Current Medications[1]   Objective:     Vitals:   07/28/24 0845  Pulse: 76  SpO2: 95%  Weight: (!) 305 lb (138.3 kg)  Height: 5' 2 (1.575 m)      Body mass index is 55.79 kg/m.    Physical Exam:    Gen: Appears well, nad, nontoxic and pleasant Psych: Alert and oriented, appropriate mood and affect Neuro: sensation intact, strength is 5/5 in upper and lower extremities, muscle tone wnl Skin: no susupicious lesions or rashes   Back - Normal skin, Spine with normal alignment and no deformity.   tenderness to lumbar vertebral process palpation.   Bilateral lumbar paraspinous muscles are tender and without spasm, worse on right NTTP gluteal musculature Straight leg raise positive right Piriformis Test negative Gait normal  Low back pain mildly worsened by lumbar extension.  No pain with rotation    Electronically signed by:  [name] D.[facility] 9:09 AM 07/28/2024     [1]  Current Outpatient Medications:    albuterol  (VENTOLIN  HFA) 108 (90 Base) MCG/ACT inhaler, Inhale 1-2 puffs into the lungs every 6 (six) hours as needed for wheezing or shortness of breath., Disp: 6.7 g, Rfl: 1   ALPRAZolam  (XANAX ) 0.25 MG tablet, Take 1 tablet (0.25 mg total) by mouth 2 (two) times daily as needed for anxiety., Disp: 20 tablet, Rfl: 0   brompheniramine-pseudoephedrine-DM 30-2-10 MG/5ML syrup, Take 5 mLs by mouth 4 (four) times daily as needed., Disp: 120 mL, Rfl:  0   cetirizine (ZYRTEC) 10 MG tablet, Take 10 mg by mouth daily., Disp: , Rfl:    EPINEPHrine  0.3 mg/0.3 mL IJ SOAJ injection, Inject 0.3 mg into the muscle as needed for anaphylaxis., Disp: 1 each, Rfl: 0   fluconazole  (DIFLUCAN ) 150 MG tablet, Take 1 tablet PO once. Repeat in 3 days if needed., Disp: 2 tablet, Rfl: 0   fluticasone  (FLONASE ) 50 MCG/ACT nasal spray, Place 2 sprays into both nostrils daily., Disp: 16 mL, Rfl: 0   human chorionic gonadotropin  (PREGNYL )  10000 units injection, Inject 6.5 mLs (6,500 Units total) as directed as directed., Disp: 1 each, Rfl: 3   hydrOXYzine  (ATARAX ) 25 MG tablet, Take 1 tablet (25 mg total) by mouth 3 (three) times daily as needed., Disp: 90 tablet, Rfl: 2   letrozole  (FEMARA ) 2.5 MG tablet, Take 3 tablets by mouth at bedtime on cycle days 3-7, Disp: 15 tablet, Rfl: 3   lisdexamfetamine  (VYVANSE ) 50 MG capsule, Take 1 capsule (50 mg total) by mouth daily., Disp: 30 capsule, Rfl: 0   metFORMIN  (GLUCOPHAGE -XR) 500 MG 24 hr tablet, Take 1 tablet (500 mg total) by mouth daily with breakfast., Disp: 30 tablet, Rfl: 0   montelukast  (SINGULAIR ) 10 MG tablet, Take 1 tablet (10 mg total) by mouth at bedtime., Disp: 90 tablet, Rfl: 1   Prenatal Vit-Fe Fumarate-FA (PRENATAL MULTIVITAMIN) TABS tablet, Take 1 tablet by mouth daily at 12 noon., Disp: , Rfl:    sertraline  (ZOLOFT ) 100 MG tablet, Take 1.5 tablets (150 mg total) by mouth daily., Disp: 135 tablet, Rfl: 0   valACYclovir (VALTREX) 1000 MG tablet, Take 1,000 mg by mouth as needed., Disp: , Rfl:    Vitamin D , Ergocalciferol , (DRISDOL ) 1.25 MG (50000 UNIT) CAPS capsule, Take 1 capsule (50,000 Units total) by mouth every 7 (seven) days., Disp: 8 capsule, Rfl: 0

## 2024-07-28 ENCOUNTER — Ambulatory Visit (INDEPENDENT_AMBULATORY_CARE_PROVIDER_SITE_OTHER): Admitting: Sports Medicine

## 2024-07-28 ENCOUNTER — Encounter: Payer: Self-pay | Admitting: Sports Medicine

## 2024-07-28 VITALS — HR 76 | Ht 62.0 in | Wt 305.0 lb

## 2024-07-28 DIAGNOSIS — M47816 Spondylosis without myelopathy or radiculopathy, lumbar region: Secondary | ICD-10-CM

## 2024-07-28 DIAGNOSIS — G8929 Other chronic pain: Secondary | ICD-10-CM

## 2024-07-28 DIAGNOSIS — M5441 Lumbago with sciatica, right side: Secondary | ICD-10-CM

## 2024-07-28 MED ORDER — KETOROLAC TROMETHAMINE 60 MG/2ML IM SOLN
60.0000 mg | Freq: Once | INTRAMUSCULAR | Status: AC
  Administered 2024-07-28: 09:00:00 60 mg via INTRAMUSCULAR

## 2024-07-28 MED ORDER — METHYLPREDNISOLONE ACETATE 80 MG/ML IJ SUSP
80.0000 mg | Freq: Once | INTRAMUSCULAR | Status: AC
  Administered 2024-07-28: 09:00:00 80 mg via INTRAMUSCULAR

## 2024-07-28 NOTE — Addendum Note (Signed)
 Addended by: TONNIE SHU D on: 07/28/2024 09:18 AM   Modules accepted: Orders

## 2024-07-28 NOTE — Patient Instructions (Addendum)
 Tylenol  [phone] mg 2-3 times a day for pain relief   Continue HEP   MRI referral  Full Cocktail injection today. No NSAIDs for 24 hrs.   Follow up 1 week after MRI to discuss results

## 2024-07-29 DIAGNOSIS — F4310 Post-traumatic stress disorder, unspecified: Secondary | ICD-10-CM | POA: Diagnosis not present

## 2024-07-29 DIAGNOSIS — Z319 Encounter for procreative management, unspecified: Secondary | ICD-10-CM | POA: Diagnosis not present

## 2024-07-29 DIAGNOSIS — Z3169 Encounter for other general counseling and advice on procreation: Secondary | ICD-10-CM | POA: Diagnosis not present

## 2024-08-07 ENCOUNTER — Other Ambulatory Visit (HOSPITAL_COMMUNITY): Payer: Self-pay

## 2024-08-08 ENCOUNTER — Other Ambulatory Visit (HOSPITAL_COMMUNITY): Payer: Self-pay

## 2024-08-08 MED ORDER — SERTRALINE HCL 100 MG PO TABS
150.0000 mg | ORAL_TABLET | Freq: Every day | ORAL | 2 refills | Status: AC
  Filled 2024-08-08: qty 135, 90d supply, fill #0

## 2024-08-09 ENCOUNTER — Ambulatory Visit
Admission: RE | Admit: 2024-08-09 | Discharge: 2024-08-09 | Disposition: A | Discharge status: Home / Self Care | Source: Ambulatory Visit | Attending: Sports Medicine | Admitting: Sports Medicine

## 2024-08-09 DIAGNOSIS — G8929 Other chronic pain: Secondary | ICD-10-CM

## 2024-08-09 DIAGNOSIS — M47816 Spondylosis without myelopathy or radiculopathy, lumbar region: Secondary | ICD-10-CM

## 2024-08-16 ENCOUNTER — Ambulatory Visit: Payer: Self-pay | Admitting: Sports Medicine

## 2024-08-24 NOTE — Progress Notes (Signed)
 "               [facility] [address] [place] [address] Phone: [phone]   Assessment and Plan:     1. Chronic bilateral low back pain with right-sided sciatica (Primary) 2. 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  Consistent with findings on MRI including severe facet hypertrophy at L4-L5 creating moderate right neural foraminal stenosis - Recommend epidural CSI to right sided L4-L5.  Order placed.  Patient will discuss epidural CSI with fertility physician at follow-up visit tomorrow. - Do not recommend p.o. NSAIDs or prednisone  due to current IVF treatments with goal of pregnancy - Continue HEP and physical activity as tolerated - Use Tylenol  500 to 1000 mg tablets 2-3 times a day for day-to-day pain relief - May use topical Voltaren  gel, lidocaine  patches as needed  Pertinent previous records reviewed include lumbar MRI 08/09/2024 IMPRESSION: 1. Advanced facet arthrosis at L4-L5 with trace anterolisthesis and moderate right neural foraminal stenosis. 2. Small central disc protrusion at L5-S1 without associated stenosis.   Follow Up: 2 weeks after epidural to review benefit and discuss treatment plan.  Could consider repeat epidural if necessary   Subjective:   I, Tracy Chang, am serving as a neurosurgeon for [name]   Chief Complaint: low back pain    HPI:    06/25/2023 Patient is 39 year old female with concerns of low back pain . Patient states that she has pain with walking, if she sits the pain will ease. Pain started a couple of months ago. Pain does not radiate . The pain is a jolting shooting pain. Tylenol  and ibu don't work. She is trying to get pregnant    07/28/2024 Patient states low back pain that radiates down the leg. Pain is increased when she is up and walking.  She states she gets a burning sensation the longer she stands and walks. Does endorse some numbness and tingling. Tylenol  and ibu take the edge off. Pain is relieved when she lays down but comes right back when she stands up  08/25/2024 Patient states back is still in pain. Cocktail did not help   Relevant Historical Information: None pertinent  Additional pertinent review of systems negative.  Current Medications[1]   Objective:     Vitals:   08/25/24 1032  Pulse: 85  SpO2: 98%  Weight: (!) 305 lb (138.3 kg)  Height: 5' 2 (1.575 m)      Body mass index is 55.79 kg/m.    Physical Exam:    Gen: Appears well, nad, nontoxic and pleasant Psych: Alert and oriented, appropriate mood and affect Neuro: sensation intact, strength is 5/5 in upper and lower extremities, muscle tone wnl Skin: no susupicious lesions or rashes   Back - Normal skin, Spine with normal alignment and no deformity.   tenderness to lumbar vertebral process palpation.   Bilateral lumbar paraspinous muscles are tender and without spasm, worse on right NTTP gluteal musculature Straight leg raise positive right Piriformis Test negative Gait normal  Low back pain mildly worsened by lumbar extension.  No pain with rotation    Electronically signed by:  [facility] 11:02 AM 08/25/24     [1]  Current Outpatient Medications:    albuterol  (VENTOLIN  HFA) 108 (90 Base) MCG/ACT inhaler, Inhale 1-2 puffs into the lungs every  6 (six) hours as needed for wheezing or shortness of breath., Disp: 6.7 g, Rfl: 1   ALPRAZolam  (XANAX ) 0.25 MG tablet, Take 1 tablet (0.25 mg total) by mouth 2 (two) times daily as needed for anxiety., Disp: 20 tablet, Rfl: 0   brompheniramine-pseudoephedrine-DM 30-2-10 MG/5ML syrup, Take 5 mLs by mouth 4 (four) times daily as needed., Disp: 120 mL, Rfl: 0   cetirizine (ZYRTEC) 10 MG tablet, Take 10 mg by mouth daily., Disp: , Rfl:    EPINEPHrine  0.3 mg/0.3  mL IJ SOAJ injection, Inject 0.3 mg into the muscle as needed for anaphylaxis., Disp: 1 each, Rfl: 0   fluconazole  (DIFLUCAN ) 150 MG tablet, Take 1 tablet PO once. Repeat in 3 days if needed., Disp: 2 tablet, Rfl: 0   fluticasone  (FLONASE ) 50 MCG/ACT nasal spray, Place 2 sprays into both nostrils daily., Disp: 16 mL, Rfl: 0   human chorionic gonadotropin  (PREGNYL ) 10000 units injection, Inject 6.5 mLs (6,500 Units total) as directed as directed., Disp: 1 each, Rfl: 3   hydrOXYzine  (ATARAX ) 25 MG tablet, Take 1 tablet (25 mg total) by mouth 3 (three) times daily as needed., Disp: 90 tablet, Rfl: 2   letrozole  (FEMARA ) 2.5 MG tablet, Take 3 tablets by mouth at bedtime on cycle days 3-7, Disp: 15 tablet, Rfl: 3   lisdexamfetamine  (VYVANSE ) 50 MG capsule, Take 1 capsule (50 mg total) by mouth daily., Disp: 30 capsule, Rfl: 0   metFORMIN  (GLUCOPHAGE -XR) 500 MG 24 hr tablet, Take 1 tablet (500 mg total) by mouth daily with breakfast., Disp: 30 tablet, Rfl: 0   montelukast  (SINGULAIR ) 10 MG tablet, Take 1 tablet (10 mg total) by mouth at bedtime., Disp: 90 tablet, Rfl: 1   Prenatal Vit-Fe Fumarate-FA (PRENATAL MULTIVITAMIN) TABS tablet, Take 1 tablet by mouth daily at 12 noon., Disp: , Rfl:    sertraline  (ZOLOFT ) 100 MG tablet, Take 1.5 tablets (150 mg total) by mouth daily., Disp: 135 tablet, Rfl: 2   valACYclovir (VALTREX) 1000 MG tablet, Take 1,000 mg by mouth as needed., Disp: , Rfl:    Vitamin D , Ergocalciferol , (DRISDOL ) 1.25 MG (50000 UNIT) CAPS capsule, Take 1 capsule (50,000 Units total) by mouth every 7 (seven) days., Disp: 8 capsule, Rfl: 0  "

## 2024-08-25 ENCOUNTER — Ambulatory Visit: Admitting: Sports Medicine

## 2024-08-25 VITALS — HR 85 | Ht 62.0 in | Wt 305.0 lb

## 2024-08-25 DIAGNOSIS — M47816 Spondylosis without myelopathy or radiculopathy, lumbar region: Secondary | ICD-10-CM | POA: Diagnosis not present

## 2024-08-25 DIAGNOSIS — G8929 Other chronic pain: Secondary | ICD-10-CM | POA: Diagnosis not present

## 2024-08-25 DIAGNOSIS — M5441 Lumbago with sciatica, right side: Secondary | ICD-10-CM

## 2024-08-25 NOTE — Patient Instructions (Addendum)
 Epidural right L4-5   Follow up 2 weeks after to discuss results   Tylenol  [phone] mg 2-3 times a day for pain relief   Voltaren  gel over areas of pain as needed

## 2024-09-13 ENCOUNTER — Other Ambulatory Visit (HOSPITAL_COMMUNITY): Payer: Self-pay

## 2024-09-13 MED ORDER — CLOMIPHENE CITRATE 50 MG PO TABS
100.0000 mg | ORAL_TABLET | Freq: Every evening | ORAL | 3 refills | Status: AC
  Filled 2024-09-13: qty 10, 5d supply, fill #0

## 2024-09-15 ENCOUNTER — Other Ambulatory Visit (HOSPITAL_COMMUNITY): Payer: Self-pay
